# Patient Record
Sex: Male | Born: 1950 | Race: Black or African American | Hispanic: No | Marital: Married | Smoking: Former smoker
Health system: Southern US, Community
[De-identification: ages and names within clinical notes are randomized; demographics above are authoritative.]

## PROBLEM LIST (undated history)

## (undated) DIAGNOSIS — I6529 Occlusion and stenosis of unspecified carotid artery: Secondary | ICD-10-CM

## (undated) DIAGNOSIS — E119 Type 2 diabetes mellitus without complications: Secondary | ICD-10-CM

## (undated) DIAGNOSIS — K5792 Diverticulitis of intestine, part unspecified, without perforation or abscess without bleeding: Secondary | ICD-10-CM

## (undated) DIAGNOSIS — M109 Gout, unspecified: Secondary | ICD-10-CM

## (undated) DIAGNOSIS — I10 Essential (primary) hypertension: Secondary | ICD-10-CM

## (undated) DIAGNOSIS — I350 Nonrheumatic aortic (valve) stenosis: Secondary | ICD-10-CM

## (undated) DIAGNOSIS — R42 Dizziness and giddiness: Secondary | ICD-10-CM

## (undated) DIAGNOSIS — I2510 Atherosclerotic heart disease of native coronary artery without angina pectoris: Secondary | ICD-10-CM

## (undated) DIAGNOSIS — E785 Hyperlipidemia, unspecified: Secondary | ICD-10-CM

## (undated) DIAGNOSIS — I739 Peripheral vascular disease, unspecified: Secondary | ICD-10-CM

## (undated) HISTORY — DX: Occlusion and stenosis of unspecified carotid artery: I65.29

## (undated) HISTORY — DX: Atherosclerotic heart disease of native coronary artery without angina pectoris: I25.10

## (undated) HISTORY — DX: Dizziness and giddiness: R42

## (undated) HISTORY — DX: Essential (primary) hypertension: I10

## (undated) HISTORY — PX: FEMORAL ARTERY STENT: SHX1583

## (undated) HISTORY — DX: Diverticulitis of intestine, part unspecified, without perforation or abscess without bleeding: K57.92

## (undated) HISTORY — DX: Type 2 diabetes mellitus without complications: E11.9

## (undated) HISTORY — DX: Peripheral vascular disease, unspecified: I73.9

## (undated) HISTORY — DX: Gout, unspecified: M10.9

## (undated) HISTORY — DX: Hyperlipidemia, unspecified: E78.5

---

## 1996-02-22 HISTORY — PX: CARDIAC SURGERY: SHX584

## 2010-02-21 HISTORY — PX: BACK SURGERY: SHX140

## 2016-02-18 ENCOUNTER — Other Ambulatory Visit: Payer: Self-pay | Admitting: *Deleted

## 2016-02-18 DIAGNOSIS — I6522 Occlusion and stenosis of left carotid artery: Secondary | ICD-10-CM

## 2016-03-03 ENCOUNTER — Encounter: Payer: Self-pay | Admitting: Vascular Surgery

## 2016-03-07 ENCOUNTER — Ambulatory Visit (HOSPITAL_COMMUNITY)
Admission: RE | Admit: 2016-03-07 | Discharge: 2016-03-07 | Disposition: A | Discharge status: Home / Self Care | Payer: Medicare Other | Source: Ambulatory Visit | Attending: Vascular Surgery | Admitting: Vascular Surgery

## 2016-03-07 DIAGNOSIS — I6522 Occlusion and stenosis of left carotid artery: Secondary | ICD-10-CM | POA: Diagnosis present

## 2016-03-07 LAB — VAS US CAROTID
LCCAPDIAS: 22 cm/s
LCCAPSYS: 127 cm/s
LEFT ECA DIAS: -17 cm/s
LICAPDIAS: -83 cm/s
Left CCA dist dias: 24 cm/s
Left CCA dist sys: 94 cm/s
Left ICA dist dias: -26 cm/s
Left ICA dist sys: -80 cm/s
Left ICA prox sys: -217 cm/s

## 2016-03-08 ENCOUNTER — Encounter: Payer: Self-pay | Admitting: Vascular Surgery

## 2016-03-08 ENCOUNTER — Ambulatory Visit (INDEPENDENT_AMBULATORY_CARE_PROVIDER_SITE_OTHER): Payer: Medicare Other | Admitting: Vascular Surgery

## 2016-03-08 VITALS — BP 127/65 | HR 65 | Temp 98.1°F | Resp 20 | Ht 72.0 in | Wt 196.1 lb

## 2016-03-08 DIAGNOSIS — I6522 Occlusion and stenosis of left carotid artery: Secondary | ICD-10-CM | POA: Diagnosis not present

## 2016-03-08 DIAGNOSIS — R42 Dizziness and giddiness: Secondary | ICD-10-CM

## 2016-03-08 DIAGNOSIS — I6523 Occlusion and stenosis of bilateral carotid arteries: Secondary | ICD-10-CM | POA: Diagnosis not present

## 2016-03-08 NOTE — Progress Notes (Signed)
[facility]  Patient name: Glenn Rollins MRN: [id number] DOB: 1950-09-24 Sex: male  REASON FOR VISIT: Carotid stenosis, referred by Dr. Murlean Caller  HPI: Glenn Rollins is a 66 y.o. male, who presents with complaints of dizziness. This dizziness occurs routinely every day, especially when he gets up too fast or even with reaching down and opening a drawer. He has unfortunately lost consciousness and struck his head in the past. He denies any sudden weakness or numbness of the extremities. He is moving here to [facility] to live with family due to continued dizziness.   During workup for dizziness, left carotid stenosis was discovered. The patient is also complained of an intermittent swishing noise in his left ear.   He is a past medical history of PAD with stents in his bilateral legs. He denies any claudication issues. He has a history of CAD status post CABG 4 in the remote past. He quit smoking at that time. He has hypertension, hyperlipidemia and diabetes are stable. She takes a daily aspirin and statin.  Past Medical History:  Diagnosis Date  . CAD (coronary artery disease)   . Carotid artery occlusion   . Diabetes mellitus without complication (HCC)   . Diverticulitis   . Gout   . Hypertension   . Peripheral vascular disease (HCC)     History reviewed. No pertinent family history.  SOCIAL HISTORY: Social History   Social History  . Marital status: Unknown    Spouse name: N/A  . Number of children: N/A  . Years of education: N/A   Occupational History  . Not on file.   Social History Main Topics  . Smoking status: Former Games developer  . Smokeless tobacco: Never Used  . Alcohol use No  . Drug use: No  . Sexual activity: Not on file   Other Topics Concern  . Not on file   Social History Narrative  . No narrative on file    Not on File  Current Outpatient Prescriptions  Medication Sig Dispense Refill  . amLODipine  (NORVASC) 10 MG tablet Take by mouth.    Marland Kitchen aspirin 81 MG chewable tablet Chew by mouth.    Marland Kitchen atorvastatin (LIPITOR) 80 MG tablet Take by mouth.    . chlorthalidone (HYGROTON) 25 MG tablet Take by mouth.    . clopidogrel (PLAVIX) 75 MG tablet Take by mouth.    . diclofenac sodium (VOLTAREN) 1 % GEL Apply 1 gram to affected area twice daily as needed.    . ezetimibe (ZETIA) 10 MG tablet Take by mouth.    . losartan (COZAAR) 100 MG tablet Take by mouth.    . metFORMIN (GLUCOPHAGE) 500 MG tablet Take by mouth.    . Multiple Vitamin (MULTI VITAMIN DAILY PO) Take by mouth.    . Omega-3 Fatty Acids (FISH OIL) 1000 MG CAPS Take by mouth.     No current facility-administered medications for this visit.     REVIEW OF SYSTEMS:  [X]  denotes positive finding, [ ]  denotes negative finding Cardiac  Comments:  Chest pain or chest pressure:    Shortness of breath upon exertion:    Short of breath when lying flat:    Irregular heart rhythm:        Vascular    Pain in calf, thigh, or hip brought on by ambulation:    Pain in feet at night that wakes you up from your sleep:     Blood clot in your  veins:    Leg swelling:         Pulmonary    Oxygen at home:    Productive cough:     Wheezing:         Neurologic    Sudden weakness in arms or legs:     Sudden numbness in arms or legs:     Sudden onset of difficulty speaking or slurred speech:    Temporary loss of vision in one eye:     Problems with dizziness:  x       Gastrointestinal    Blood in stool:     Vomited blood:         Genitourinary    Burning when urinating:     Blood in urine:        Psychiatric    Major depression:         Hematologic    Bleeding problems:    Problems with blood clotting too easily:        Skin    Rashes or ulcers:        Constitutional    Fever or chills:      PHYSICAL EXAM: Vitals:   03/08/16 1443 03/08/16 1447  BP: 134/70 127/65  Pulse: 65   Resp: 20   Temp: 98.1 F (36.7 C)   TempSrc:  Oral   SpO2: 98%   Weight: 196 lb 1.6 oz (89 kg)   Height: 6' (1.829 m)     GENERAL: The patient is a well-nourished male, in no acute distress. The vital signs are documented above. CARDIAC: There is a regular rate and rhythm. No carotid bruits. VASCULAR: 2+ femoral pulses bilaterally. Faintly palpable right DP pulse. Non palpable left DP pulse.  PULMONARY: There is good air exchange bilaterally without wheezing or rales. ABDOMEN: Soft and non-tender. No pulsatile mass.  MUSCULOSKELETAL: There are no major deformities or cyanosis. NEUROLOGIC: No focal weakness or paresthesias are detected. SKIN: There are no ulcers or rashes noted. PSYCHIATRIC: The patient has a normal affect.  DATA:  Left carotid duplex 03/07/16  L ICA: PSV 217 cm/s EDV 83/cms: suggestive of 60-79% stenosis Left vertebral flow appears absent  Review of carotid duplex from [facility] on 10/02/2015 suggested R ICA stenosis 50-69% and antegrade vertebral flow bilaterally    MEDICAL ISSUES: Asymptomatic left carotid stenosis Dizziness  The patient's velocities on carotid duplex today are likely in the 60-70% stenosis range. He has been asymptomatic without any TIA or stroke symptoms. Discussed that he is below the threshold for surgical intervention and less he becomes symptomatic. He does complain of dizziness on a routine basis. [facility] showed that his vertebral arteries are patent. His dizziness is unlikely due vertebrobasilar insufficiency or carotid stenosis. We will refer him to neurology for evaluation of dizziness. Will follow-up in 1 year with a bilateral carotid duplex. He is on maximal medical management with aspirin and a statin. He is a former smoker.  Peripheral arterial disease  The patient has a prior history of bilateral stenting to his lower extremities. He denies any claudication symptoms at this time. If he does develop any claudication symptoms or nonhealing wounds, we will  obtain further studies at that time. Otherwise,  this will be followed as needed.  Maris BergerKimberly Trinh, PA-C [facility] [place] [phone]   Clinic MD: Ameerah Huffstetler  I have examined the patient, reviewed and agree with above. Very pleasant man moved here from [place] establishing vascular care.  Does have a severe positional dizziness. Workup revealed moderate to severe left carotid stenosis. No focal neurologic deficits. Does have a history of bilateral superficial femoral artery interventions for claudication. Had coronary bypass grafting in [place] age. Will continue follow-up of his carotid stenosis with annual duplex. He will notify should he develop any new symptoms. Will refer to neurology for evaluation of dizziness  Gretta Began, MD 03/08/2016 3:45 PM

## 2016-03-10 NOTE — Addendum Note (Signed)
Addended by: Burton ApleyPETTY, Aniket Paye A on: 03/10/2016 03:34 PM   Modules accepted: Orders

## 2016-04-12 ENCOUNTER — Ambulatory Visit (INDEPENDENT_AMBULATORY_CARE_PROVIDER_SITE_OTHER): Payer: Medicare Other | Admitting: Neurology

## 2016-04-12 ENCOUNTER — Encounter: Payer: Self-pay | Admitting: Neurology

## 2016-04-12 DIAGNOSIS — G6289 Other specified polyneuropathies: Secondary | ICD-10-CM

## 2016-04-12 DIAGNOSIS — M5416 Radiculopathy, lumbar region: Secondary | ICD-10-CM

## 2016-04-12 DIAGNOSIS — G629 Polyneuropathy, unspecified: Secondary | ICD-10-CM | POA: Insufficient documentation

## 2016-04-12 DIAGNOSIS — R42 Dizziness and giddiness: Secondary | ICD-10-CM | POA: Diagnosis not present

## 2016-04-12 NOTE — Progress Notes (Signed)
PATIENT: Glenn Rollins DOB: 24-Jul-1950  Chief Complaint  Patient presents with  . Dizziness    Orthostatic Vitals: Lying: 121/54, 63, Sitting: 131/63, 68, Standing: 130/63, 71.  He has been having positional dizziness over the last six months.  Says his evaluation with cardiology did not determine cause for symptoms.   Marland Kitchen. PCP    Murlean CallerBethany South, MD  . Cardiology    Larina Earthlyodd F Early, MD - referring MD     HISTORICAL  Glenn Rollins is a 66 years old right handed male, seen in refer by vascular surgeon Dr. Kristen Loaderodd F Early for evaluation of dizziness, his primary care physician is Dr. Murlean CallerBethany South. Initial evaluation was on Apr 12 2016.  He has past medical history of hypertension, hyperlipidemia, diabetes since 2016. He had a history of coronary artery disease, CABG surgery in 1998 at age 66, has quit smoking and drinking since then. Peripheral vascular disease, s/p bilateral femoral artery stent in 2000, had right femoral artery stent again in 2017. Prior to the most recent right femoral artery stent in 2017, he presented with low back pain, radiating pain to both hip and leg.  The right emoral artery stent did help his low back pain, bilateral hip pain.   He presented with dizziness since summer of 2017, he complains of transient dizziness with sudden positional change, such as getting up quickly from lying down, sitting down, turing head quickly, he has transient light headedness lasting for few second, he did have one epissoe,when he got up in the middle of night,  going to bathroom, he was so dizzy that he fell, hit the floor with transient loss of consicuousness.  He  Has mild right toes numbness tingling, as if there is socks in between his toes, He used to work at post office, unloading trucks, he has chronic low back pain, some times radiating pain to right leg.  I reviewed ultrasound of carotid artery report from vascular surgeon in January 2018, unable to obtain left  vertebral artery, 60-79% stenosis of left proximal internal carotid artery.  I reviewed MRI lumbar in 2015, L2-3, L3-4, L4-L5, diffuse broad-based disc, with mild facet joint change, there is post operative change right hemi-laminotomy, with superimposed right paracentral disc extrusion, moderate bilateral foraminal narrowing, mild spinal canal stenosis at L4-5, L5-S1.   I reviewed laboratory evaluation in January 2018, A1c 5.9, CMP showed mild elevated glucose otherwise was normal, CBC showed mildly decreased hemoglobin 12.2, normal LDL 95, negative hepatitis C, immunofixation protein electrophoresis,  REVIEW OF SYSTEMS: Full 14 system review of systems performed and notable only for as above  ALLERGIES: Allergies  Allergen Reactions  . Ace Inhibitors   . Prednisone     BP went up    HOME MEDICATIONS: Current Outpatient Prescriptions  Medication Sig Dispense Refill  . amLODipine (NORVASC) 10 MG tablet Take by mouth.    Marland Kitchen. aspirin 81 MG chewable tablet Chew by mouth.    Marland Kitchen. atorvastatin (LIPITOR) 80 MG tablet Take by mouth.    . chlorthalidone (HYGROTON) 25 MG tablet Take by mouth.    . clopidogrel (PLAVIX) 75 MG tablet Take by mouth.    . diclofenac sodium (VOLTAREN) 1 % GEL Apply 1 gram to affected area twice daily as needed.    . ezetimibe (ZETIA) 10 MG tablet Take by mouth.    . losartan (COZAAR) 100 MG tablet Take by mouth.    . metFORMIN (GLUCOPHAGE) 500 MG tablet Take by mouth.    .Marland Kitchen  Multiple Vitamin (MULTI VITAMIN DAILY PO) Take by mouth.    . Omega-3 Fatty Acids (FISH OIL) 1000 MG CAPS Take by mouth.     No current facility-administered medications for this visit.     PAST MEDICAL HISTORY: Past Medical History:  Diagnosis Date  . CAD (coronary artery disease)   . Carotid artery occlusion   . Diabetes mellitus without complication (HCC)   . Diverticulitis   . Dizziness   . Gout   . Hyperlipemia   . Hypertension   . Peripheral vascular disease (HCC)     PAST  SURGICAL HISTORY: Past Surgical History:  Procedure Laterality Date  . BACK SURGERY  2012  . CARDIAC SURGERY  1998   Bypass  . FEMORAL ARTERY STENT      FAMILY HISTORY: No family history on file.  SOCIAL HISTORY:  Social History   Social History  . Marital status: Unknown    Spouse name: N/A  . Number of children: 3  . Years of education: 12th grade   Occupational History  . Retired    Social History Main Topics  . Smoking status: Former Games developer  . Smokeless tobacco: Never Used     Comment: 1998  . Alcohol use No     Comment: 1998  . Drug use: No  . Sexual activity: Not on file   Other Topics Concern  . Not on file   Social History Narrative   Lives at home with daughter.   Right-handed.   1 cup caffeine daily.     PHYSICAL EXAM   Vitals:   04/12/16 1001  BP: (!) 121/54  Pulse: 63  Weight: 193 lb (87.5 kg)  Height: 6' (1.829 m)    Not recorded      Body mass index is 26.18 kg/m. Lying 121/54 63, sitting 131/63 68, standing 130/6371.  PHYSICAL EXAMNIATION:  Gen: NAD, conversant, well nourised, obese, well groomed                     Cardiovascular: Regular rate rhythm, no peripheral edema, warm, nontender. Eyes: Conjunctivae clear without exudates or hemorrhage Neck: Supple, no carotid bruits. Pulmonary: Clear to auscultation bilaterally   NEUROLOGICAL EXAM:  MENTAL STATUS: Speech:    Speech is normal; fluent and spontaneous with normal comprehension.  Cognition:     Orientation to time, place and person     Normal recent and remote memory     Normal Attention span and concentration     Normal Language, naming, repeating,spontaneous speech     Fund of knowledge   CRANIAL NERVES: CN II: Visual fields are full to confrontation. Fundoscopic exam is normal with sharp discs and no vascular changes. Pupils are round equal and briskly reactive to light. CN III, IV, VI: extraocular movement are normal. No ptosis. CN V: Facial sensation is  intact to pinprick in all 3 divisions bilaterally. Corneal responses are intact.  CN VII: Face is symmetric with normal eye closure and smile. CN VIII: Hearing is normal to rubbing fingers CN IX, X: Palate elevates symmetrically. Phonation is normal. CN XI: Head turning and shoulder shrug are intact CN XII: Tongue is midline with normal movements and no atrophy.  MOTOR: There is no pronator drift of out-stretched arms. Muscle bulk and tone are normal. Muscle strength is normal.  REFLEXES: Reflexes are 2+ and symmetric at the biceps, triceps, knees, and ankles. Plantar responses are flexor.  SENSORY: Intact to light touch, pinprick, positional sensation and vibratory sensation  are intact in fingers and toes.  COORDINATION: Rapid alternating movements and fine finger movements are intact. There is no dysmetria on finger-to-nose and heel-knee-shin.    GAIT/STANCE: Posture is normal. Gait is steady with normal steps, base, arm swing, and turning. Heel and toe walking are normal. Tandem gait is normal.  Romberg is absent.   DIAGNOSTIC DATA (LABS, IMAGING, TESTING) - I reviewed patient records, labs, notes, testing and imaging myself where available.   ASSESSMENT AND PLAN  Glenn Rollins is a 66 y.o. male   Hx of DM, now with orthostatic dizziness, possible due to orthostatic blood pressure change  EMG/NCS  Laboratory evaluation for possible etiology Right lumbar radiculopathy  Right low back pain, radiating pain to right leg    Levert Feinstein, M.D. Ph.D.  [facility] [address], [address] [place], [place] [address] Ph: [phone] Fax: [phone]  CC: Larina Earthly, MD, Murlean Caller, MD

## 2016-04-13 LAB — ANA W/REFLEX IF POSITIVE: ANA: NEGATIVE

## 2016-04-13 LAB — CK: Total CK: 158 U/L (ref 24–204)

## 2016-04-13 LAB — VITAMIN B12: VITAMIN B 12: 844 pg/mL (ref 232–1245)

## 2016-04-13 LAB — FOLATE: Folate: >20 ng/mL (ref >3.0)

## 2016-04-13 LAB — TSH: TSH: 2.22 u[IU]/mL (ref 0.450–4.500)

## 2016-04-29 ENCOUNTER — Encounter (INDEPENDENT_AMBULATORY_CARE_PROVIDER_SITE_OTHER): Payer: Self-pay

## 2016-04-29 ENCOUNTER — Ambulatory Visit (INDEPENDENT_AMBULATORY_CARE_PROVIDER_SITE_OTHER): Payer: Medicare Other | Admitting: Neurology

## 2016-04-29 DIAGNOSIS — Z0289 Encounter for other administrative examinations: Secondary | ICD-10-CM

## 2016-04-29 DIAGNOSIS — G6289 Other specified polyneuropathies: Secondary | ICD-10-CM

## 2016-04-29 DIAGNOSIS — M5416 Radiculopathy, lumbar region: Secondary | ICD-10-CM

## 2016-04-29 DIAGNOSIS — R42 Dizziness and giddiness: Secondary | ICD-10-CM

## 2016-04-29 NOTE — Procedures (Signed)
Full Name: Glenn Rollins Gender: Male MRN #: [id number] Date of Birth: 1950/05/07    Visit Date: 04/29/2016 10:47 Age: 66 Years 7 Months Old Examining Physician: Levert Feinstein, MD  Referring Physician: Terrace Arabia, MD History: 66 year old male with history of diabetes, descending with bilateral feet paresthesia, he also had a history of lumbar decompression surgery for right lumbar radiculopathy.  Summary of the test:  Nerve conduction study: Right sural and superficial sensory responses were absent, he had a history of right venous salvation for his CABG surgery in the past. Left sural, and superficial peroneal sensory responses were present, with mildly prolonged peak latency.  Right peroneal motor responses showed mild decreased conduction velocity.  Left peroneal motor responses showed mildly prolonged distal latency, mildly decreased C map amplitude, and with mildly decreased conduction velocity.  Bilateral tibial motor responses showed mildly prolonged distal latency, robust C map response, mild slow conduction velocity.  Bilateral tibial and right peroneal F waves were mildly prolonged, these were likely related to his history of diabetes, and the tall body habitus.  Electromyography: Selected needle examination was performed at bilateral lower extremity muscles and bilateral lumbar sacral paraspinal muscles. There was no significant abnormality.  Conclusion: This is a slight abnormal study. The absent right sural and peroneal sensory responses could due to previous right venous salvation surgery. The prolonged distal latency and F-wave latency mild slow conduction velocity on multiple motor nerves tested could due to cold limb temperature and his tall body habitus.  There is no electrodiagnostic evidence of large fiber peripheral neuropathy or bilateral lumbosacral radiculopathy.    ------------------------------- Levert Feinstein, M.D.  [facility] [address] [place], [place] [address] Tel: [phone] Fax: [phone]        [facility]    Nerve / Sites Rec. Site Peak Lat Ref. Amp.1-2 Ref. Distance    ms ms V V cm  R Sural - Ankle (Calf)     Calf Ankle NR ?4.40 NR ?6.0 14  L Sural - Ankle (Calf)     Calf Ankle 4.53 ?4.40 8.3 ?6.0 14  R Superficial peroneal - Ankle     Lat leg Ankle NR ?4.40 NR ?6.0 14  L Superficial peroneal - Ankle     Lat leg Ankle 5.00 ?4.40 7.0 ?6.0 14     [facility]    Nerve / Sites Muscle Latency Ref. Amplitude Ref. Rel Amp Segments Distance Lat Diff Velocity Ref. Area    ms ms mV mV %  cm ms m/s m/s mVms  R Peroneal - EDB     Ankle EDB 6.1 ?6.5 3.9 ?2.0 100 Ankle - EDB 9    10.6     Fib head EDB 13.1  2.7  69.6 Fib head - Ankle 26 7.0 37 ?44 9.7     Pop fossa EDB 16.2  2.4  87.9 Pop fossa - Fib head 12 3.1 38 ?44 8.8         Pop fossa - Ankle  10.1     L Peroneal - EDB     Ankle EDB 7.1 ?6.5 1.5 ?2.0 100 Ankle - EDB 9    6.1     Fib head EDB 14.9  1.2  83.1 Fib head - Ankle 28 7.8 36 ?44 5.1     Pop fossa EDB 17.3  1.1  87.1 Pop fossa - Fib head 10 2.4 41 ?44 4.8         Pop fossa - Ankle  10.3     R Tibial - AH     Ankle AH 5.8 ?5.8 4.7 ?4.0 100 Ankle - AH 9    10.2     Pop fossa AH 17.9  1.7  36.4 Pop fossa - Ankle 42 12.0 35 ?41 4.8  L Tibial - AH     Ankle AH 6.0 ?5.8 7.1 ?4.0 100 Ankle - AH 9    18.1     Pop fossa AH 17.4  5.1  71.1 Pop fossa - Ankle 42 11.5 37 ?41 16.8     F  Wave    Nerve F Lat Ref.   ms ms  R Peroneal - EDB 60.4 ?56.0  R Tibial - AH 61.7 ?56.0  L Tibial - AH 61.9 ?56.0     EMG full       EMG Summary Table    Spontaneous MUAP Recruitment  Muscle IA Fib PSW Fasc Other Amp Dur. Poly Pattern  R. Tibialis posterior Normal None None None _______ Normal Normal Normal Normal  R. Tibialis anterior Normal None None None _______ Normal Normal Normal Normal  R. Vastus lateralis Normal None None None _______ Normal Normal Normal Normal  L. Tibialis anterior Normal None None None _______  Normal Normal Normal Normal  L. Vastus lateralis Normal None None None _______ Normal Normal Normal Normal  L. Gastrocnemius (Medial head) Normal None None None _______ Normal Normal Normal Normal  L. Lumbar paraspinals (mid) Normal None None None _______ Normal Normal Normal Normal  L. Lumbar paraspinals (low) Normal None None None _______ Normal Normal Normal Normal  R. Lumbar paraspinals (low) Normal None None None _______ Normal Normal Normal Normal  R. Lumbar paraspinals (mid) Normal None None None _______ Normal Normal Normal Normal

## 2016-09-02 ENCOUNTER — Ambulatory Visit: Payer: Medicare Other | Attending: Family Medicine

## 2016-09-02 DIAGNOSIS — R42 Dizziness and giddiness: Secondary | ICD-10-CM

## 2016-09-02 DIAGNOSIS — R2689 Other abnormalities of gait and mobility: Secondary | ICD-10-CM | POA: Diagnosis present

## 2016-09-02 NOTE — Therapy (Signed)
[facility] [address] [place], [place], [address] Phone: [phone]   Fax:  [phone]  Physical Therapy Evaluation  Patient Details  Name: Glenn Rollins MRN: [id number] Date of Birth: [birth year] Referring Provider: Dr. Evlyn KannerSouth  Encounter Date: 09/02/2016      PT End of Session - 09/02/16 1002    Visit Number 1   Number of Visits 5   Date for PT Re-Evaluation 10/02/16   Authorization Type Medicare and BCBS Federal secondary (G-CODE AND PN EVERY 10TH VISIT)   PT Start Time 0848   PT Stop Time 0933   PT Time Calculation (min) 45 min   Equipment Utilized During Treatment Gait belt   Activity Tolerance Patient tolerated treatment well   Behavior During Therapy WFL for tasks assessed/performed      Past Medical History:  Diagnosis Date  . CAD (coronary artery disease)   . Carotid artery occlusion   . Diabetes mellitus without complication (HCC)   . Diverticulitis   . Dizziness   . Gout   . Hyperlipemia   . Hypertension   . Peripheral vascular disease [facility](HCC)     Past Surgical History:  Procedure Laterality Date  . BACK SURGERY  2012  . CARDIAC SURGERY  1998   Bypass  . FEMORAL ARTERY STENT      There were no vitals filed for this visit.       Subjective Assessment - 09/02/16 0856    Subjective Pt reported dizziness began approx. 1 year ago, first episode was at church and he felt like the world was moving. Dizziness described as lightheadedness now vs. world moving, worse when looking quickly, lying back and lifting weights at gym, bending forward. It is also worse when standing and he did pass out once, when getting OOB. Looking down also incr. dizziness. Pt reports dizziness at worst is 5/10 and at best: 0/10 (if pt sits down). Pt passed out once, and fell but no falls other than passing out. Pt denied HA, N/T, and weakness.    Pertinent History CAD, PVD, orthostatic hypotension, peripheral  neuropathy (R toes), DM, Lx radiculopathy, Hx of B femoral stents with revision in 02/2016, gout, Carotid occulsion (pt reported 70%)   Patient Stated Goals Find out why dizziness is happening   Currently in Pain? No/denies            [facility] PT Assessment - 09/02/16 0859      Assessment   Medical Diagnosis Dizziness and giddiness   Referring Provider Dr. Evlyn Kanner[place]   Onset Date/Surgical Date 09/03/15   Hand Dominance Right   Prior Therapy none     Precautions   Precautions Fall     Restrictions   Weight Bearing Restrictions No     Balance Screen   Has the patient fallen in the past 6 months No   Has the patient had a decrease in activity level because of a fear of falling?  Yes   Is the patient reluctant to leave their home because of a fear of falling?  No     Home Environment   Living Environment Private residence   Living Arrangements Children  dtr   Available Help at Discharge Family   Type of Home House   Home Access Level entry   Home Layout Two level   Alternate Level Stairs-Number of Steps 12    Alternate Level Stairs-Rails Right   Home Equipment None     Prior Function   Level  of Independence Independent   Vocation Retired   Personnel officer, Conservation officer, historic buildings, Forensic scientist at [employer], [employer] cars, garden     Cognition   Overall Cognitive Status Within Functional Limits for tasks assessed     Sensation   Light Touch Impaired by gross assessment   Additional Comments Pt reports R toe N/T 2/2 peripheral neuropathy     Ambulation/Gait   Ambulation/Gait Yes   Ambulation/Gait Assistance 5: Supervision   Ambulation/Gait Assistance Details Pt amb. in guarded manner and required rest break upon standing and turning 2/2 lightheadedness.   Ambulation Distance (Feet) 100 Feet   Assistive device None   Gait Pattern Step-through pattern;Within Functional Limits   Ambulation Surface Level;Indoor   Gait velocity 3.63ft/sec. no AD     Functional Gait  Assessment   Gait  assessed  Yes   Gait Level Surface Walks 20 ft in less than 7 sec but greater than 5.5 sec, uses assistive device, slower speed, mild gait deviations, or deviates 6-10 in outside of the 12 in walkway width.  7.8 sec.   Change in Gait Speed Able to change speed, demonstrates mild gait deviations, deviates 6-10 in outside of the 12 in walkway width, or no gait deviations, unable to achieve a major change in velocity, or uses a change in velocity, or uses an assistive device.   Gait with Horizontal Head Turns Performs head turns smoothly with no change in gait. Deviates no more than 6 in outside 12 in walkway width   Gait with Vertical Head Turns Performs task with slight change in gait velocity (eg, minor disruption to smooth gait path), deviates 6 - 10 in outside 12 in walkway width or uses assistive device   Gait and Pivot Turn Pivot turns safely within 3 sec and stops quickly with no loss of balance.   Step Over Obstacle Is able to step over 2 stacked shoe boxes taped together (9 in total height) without changing gait speed. No evidence of imbalance.   Gait with Narrow Base of Support Is able to ambulate for 10 steps heel to toe with no staggering.   Gait with Eyes Closed Walks 20 ft, uses assistive device, slower speed, mild gait deviations, deviates 6-10 in outside 12 in walkway width. Ambulates 20 ft in less than 9 sec but greater than 7 sec.   Ambulating Backwards Walks 20 ft, uses assistive device, slower speed, mild gait deviations, deviates 6-10 in outside 12 in walkway width.   Steps Alternating feet, no rail.   Total Score 25   FGA comment: Indicates pt is a low risk for falls. Pt experienced slight incr. dizziness during activities which require incr. vestibular input.            Vestibular Assessment - 09/02/16 0906      Symptom Behavior   Type of Dizziness Lightheadedness  and world moves   Frequency of Dizziness Daily   Duration of Dizziness <1 minute   Aggravating Factors  Turning head quickly;Turning body quickly;Sit to stand  bending foward, pt also felt dizzy after driving last week   Relieving Factors Rest;Slow movements     Occulomotor Exam   Occulomotor Alignment Normal   Spontaneous Absent   Gaze-induced Absent   Smooth Pursuits Intact   Saccades Intact   Comment Pt denied reproduction of dizziness during exam. B HIT (-)     Vestibulo-Occular Reflex   VOR 1 Head Only (x 1 viewing) WNL   VOR Cancellation Normal     Positional  Testing   Dix-Hallpike Dix-Hallpike Right;Dix-Hallpike Left   Horizontal Canal Testing Horizontal Canal Right;Horizontal Canal Left     Dix-Hallpike Right   Dix-Hallpike Right Duration none   Dix-Hallpike Right Symptoms No nystagmus     Dix-Hallpike Left   Dix-Hallpike Left Duration none   Dix-Hallpike Left Symptoms No nystagmus     Horizontal Canal Right   Horizontal Canal Right Duration none   Horizontal Canal Right Symptoms Normal     Horizontal Canal Left   Horizontal Canal Left Duration none   Horizontal Canal Left Symptoms Normal     Orthostatics   BP supine (x 5 minutes) 138/63  no dizziness in supine   HR supine (x 5 minutes) 60   BP sitting 127/58  no dizziness upon sitting upright   HR sitting 60   BP standing (after 1 minute) 130/56  no dizziness   HR standing (after 1 minute) 66        Objective measurements completed on examination: See above findings.                  PT Education - 09/02/16 1001    Education provided Yes   Education Details PT educated pt on exam findings and PT POC, frequency, and duration. PT educated pt that we will provide pt with vestibular exercises and then refer back to MD.   Person(s) Educated Patient   Methods Explanation   Comprehension Verbalized understanding          PT Short Term Goals - 09/02/16 1013      PT SHORT TERM GOAL #1   Title same as LTGs           PT Long Term Goals - 09/02/16 1013      PT LONG TERM GOAL #1    Title Pt will be IND in HEP to improve balance and safety. TARGET DATE FOR ALL LTGS: 09/30/16   Status New     PT LONG TERM GOAL #2   Title Pt will improve FGA score to 30/30 to reduce falls risk.    Status New     PT LONG TERM GOAL #3   Title Pt will perform head turns and 180 turns with </=2/10 dizziness to improve safety.   Status New                Plan - 09/02/16 1005    Clinical Impression Statement Pt is a pleasant 65y/o male presenting to OPPT neuro for dizziness. Pt's PMH history is signficant for the following: CAD, PVD, orthostatic hypotension, peripheral neuropathy (R toes), DM, Lx radiculopathy, Hx of B femoral stents with revision in 02/2016, gout, Carotid occulsion (pt reported 70%). The following deficits were noted upon exam: gait deviations during dynamic activities, dizziness, and impaired balance. All positional testing negative for sx's and nystagmus. Orthostatic hypotension testing negative but pt did report dizziness upon standing during exam and when standing to amb. from lobby to gym. Pt's FGA score indicates pt is at low risk for falls and gait speed is WNL. However, pt did experience incr. postural sway and dizziness during activities which required incr. vestibular input during FGA, therefore, PT will see pt for several visits to improve vestibular input. Pt would benefit from skilled PT to improve safety during functional mobility.    History and Personal Factors relevant to plan of care: Pt ablity to assist at church is limited 2/2 dizziness.    Clinical Presentation due to: CAD, PVD, orthostatic hypotension, peripheral neuropathy (  R toes), DM, Lx radiculopathy, Hx of B femoral stents with revision in 02/2016, gout, Carotid occulsion (pt reported 70%)   Rehab Potential Good   Clinical Impairments Affecting Rehab Potential see above   PT Frequency 1x / week   PT Duration 4 weeks   PT Treatment/Interventions Canalith Repostioning;Neuromuscular  re-education;Balance training;Therapeutic exercise;Therapeutic activities;Functional mobility training;Stair training;Gait training;DME Instruction;Patient/family education;Vestibular   PT Next Visit Plan Provide pt with vestibular HEP and potentially d/c after 1-2 visits   Consulted and Agree with Plan of Care Patient      Patient will benefit from skilled therapeutic intervention in order to improve the following deficits and impairments:  Abnormal gait, Dizziness, Decreased balance  Visit Diagnosis: Dizziness and giddiness - Plan: PT plan of care cert/re-cert  Other abnormalities of gait and mobility - Plan: PT plan of care cert/re-cert      G-Codes - 09/09/2016 1015    Functional Assessment Tool Used (Outpatient Only) FGA: 25/30   Functional Limitation Mobility: Walking and moving around   Mobility: Walking and Moving Around Current Status (O9629) At least 1 percent but less than 20 percent impaired, limited or restricted   Mobility: Walking and Moving Around Goal Status (B2841) 0 percent impaired, limited or restricted       Problem List Patient Active Problem List   Diagnosis Date Noted  . Lumbar radiculopathy, right 04/12/2016  . Peripheral neuropathy 04/12/2016  . Orthostatic dizziness 04/12/2016    Maxcine Strong L 2016/09/09, 10:16 AM  [facility] [address] [place], [place], [address] Phone: [phone]   Fax:  [phone]  Name: Glenn Rollins MRN: [id number] Date of Birth: March 25, 1950  Zerita Boers, PT,DPT 2016/09/09 10:17 AM Phone: [phone] Fax: [phone]

## 2016-09-06 ENCOUNTER — Ambulatory Visit: Payer: Medicare Other

## 2016-09-06 DIAGNOSIS — R42 Dizziness and giddiness: Secondary | ICD-10-CM

## 2016-09-06 DIAGNOSIS — R2689 Other abnormalities of gait and mobility: Secondary | ICD-10-CM

## 2016-09-06 NOTE — Patient Instructions (Addendum)
Turning    Tilt head down 15-30, lead with head and eyes and slowly make quarter turn to left with eyes open. Hold position until symptoms subside. Repeat __5__ times per session. Do __1__ sessions per day.  Copyright  VHI. All rights reserved.   Perform in corner with chair in front of you for safety OR at kitchen sink with chair behind you:  Feet Together (Compliant Surface) Head Motion - Eyes Closed    Stand on compliant surface: ___pillows/cushion_____ with feet together. Close eyes and move head slowly, up and down 5 times and side to side 5 times. Perform up and down head motion in limited range, as we did during PT session. Repeat _3___ times per session. Do __1__ sessions per day.  Copyright  VHI. All rights reserved.  Up / Down Head Motion    Perform near counter or in hallway to hold on as needed. Walking on solid surface, move head and eyes toward ceiling for __2__ steps.  Then, move head and eyes toward floor for ___2_ steps. Repeat __4__ times per session. Do __1__ sessions per day.   Copyright  VHI. All rights reserved.  Side to Side Head Motion    Perform at counter or in hallway to hold onto for safety. Walking on solid surface, turn head and eyes to left for ____ steps. Then, turn head and eyes to opposite side for __2__ steps. Repeat sequence __3__ times per session. Do __1__ sessions per day.  Copyright  VHI. All rights reserved.

## 2016-09-06 NOTE — Therapy (Signed)
[facility] [address] [place], [place], [address] Phone: [phone]   Fax:  [phone]  Physical Therapy Treatment  Patient Details  Name: Glenn Rollins MRN: [id number] Date of Birth: 1950/06/03 Referring Provider: Dr. Forde Dandy  Encounter Date: 09/06/2016      PT End of Session - 09/06/16 1045    Visit Number 2   Number of Visits 5   Date for PT Re-Evaluation 10/02/16   Authorization Type Medicare and [place] secondary (G-CODE AND PN EVERY 10TH VISIT)   PT Start Time 1016   PT Stop Time 1042   PT Time Calculation (min) 26 min   Equipment Utilized During Treatment --  S prn   Activity Tolerance Patient tolerated treatment well   Behavior During Therapy WFL for tasks assessed/performed      Past Medical History:  Diagnosis Date  . CAD (coronary artery disease)   . Carotid artery occlusion   . Diabetes mellitus without complication ([place])   . Diverticulitis   . Dizziness   . Gout   . Hyperlipemia   . Hypertension   . Peripheral vascular disease [facility])     Past Surgical History:  Procedure Laterality Date  . BACK SURGERY  2012  . CARDIAC SURGERY  1998   Bypass  . FEMORAL ARTERY STENT      There were no vitals filed for this visit.      Subjective Assessment - 09/06/16 1019    Subjective Pt denied changes or falls since last visit. Pt went to the gym yesterday and reported he continues to have dizziness upon standing after seated exercises. Pt reported blurred vision after performing biceps machine.    Pertinent History CAD, PVD, orthostatic hypotension, peripheral neuropathy (R toes), DM, Lx radiculopathy, Hx of B femoral stents with revision in 02/2016, gout, Carotid occulsion (pt reported 70%)   Patient Stated Goals Find out why dizziness is happening   Currently in Pain? No/denies                              Balance Exercises - 09/06/16 1049      Balance  Exercises: Standing   Gait with Head Turns Forward;Other reps (comment)  8 reps, vertical/horizontal head turns in // bars   Turning Both;5 reps  half turns with spotting target           PT Education - 09/06/16 1044    Education provided Yes   Education Details PT provided habituation and vestibular HEP, and that PT feels pt's s/s need to be managed by MD as s/s are consistent with vascular issues. PT will hold pt, as pt has appt with MD tomorrow (09/07/16) and then likely d/c.   Person(s) Educated Patient   Methods Explanation;Demonstration;Verbal cues;Handout   Comprehension Returned demonstration;Verbalized understanding          PT Short Term Goals - 09/02/16 1013      PT SHORT TERM GOAL #1   Title same as LTGs           PT Long Term Goals - 09/06/16 1047      PT LONG TERM GOAL #1   Title Pt will be IND in HEP to improve balance and safety. TARGET DATE FOR ALL LTGS: 09/30/16   Status New     PT LONG TERM GOAL #2   Title Pt will improve FGA score to 30/30 to reduce falls risk.  Status New     PT LONG TERM GOAL #3   Title Pt will perform head turns and 180 turns with </=2/10 dizziness to improve safety.   Status Achieved               Plan - 09/06/16 1046    Clinical Impression Statement PT unable to reproduce s/s today during session. LTG 2 met. However, PT provided pt with habituation and vestibular HEP to ensure vestibular input remains WNL. Pt reported dizziness during gym activities which required incr. cervical ext. Pt's s/s are consistent with vascular issues and PT encouraged pt to f/u with MD, as this is outside PT scope of practice. PT will hold pt until he sees MD tomorrow and then likely d/c pt.    Rehab Potential Good   Clinical Impairments Affecting Rehab Potential see above   PT Frequency 1x / week   PT Duration 4 weeks   PT Treatment/Interventions Canalith Repostioning;Neuromuscular re-education;Balance training;Therapeutic  exercise;Therapeutic activities;Functional mobility training;Stair training;Gait training;DME Instruction;Patient/family education;Vestibular   PT Next Visit Plan d/c   Consulted and Agree with Plan of Care Patient      Patient will benefit from skilled therapeutic intervention in order to improve the following deficits and impairments:  Abnormal gait, Dizziness, Decreased balance  Visit Diagnosis: Dizziness and giddiness  Other abnormalities of gait and mobility     Problem List Patient Active Problem List   Diagnosis Date Noted  . Lumbar radiculopathy, right 04/12/2016  . Peripheral neuropathy 04/12/2016  . Orthostatic dizziness 04/12/2016    Iman Reinertsen L 09/06/2016, 10:49 AM  [facility] [address] [place], [place], [address] Phone: [phone]   Fax:  [phone]  Name: Glenn Rollins MRN: [id number] Date of Birth: [birth year]  Geoffry Paradise, PT,DPT 09/06/16 10:50 AM Phone: [phone] Fax: [phone]

## 2016-09-13 ENCOUNTER — Other Ambulatory Visit: Payer: Self-pay | Admitting: *Deleted

## 2016-09-13 DIAGNOSIS — I6523 Occlusion and stenosis of bilateral carotid arteries: Secondary | ICD-10-CM

## 2016-09-27 NOTE — Therapy (Signed)
[facility] [address] [place], [place], [address] Phone: [phone]   Fax:  [phone]  Patient Details  Name: Glenn Rollins MRN: [id number] Date of Birth: 10-24-1950 Referring Provider:  No ref. provider found  Encounter Date: 10-27-16  PHYSICAL THERAPY DISCHARGE SUMMARY  Visits from Start of Care: 2  Current functional level related to goals / functional outcomes:     PT Long Term Goals - 09/06/16 1047      PT LONG TERM GOAL #1   Title Pt will be IND in HEP to improve balance and safety. TARGET DATE FOR ALL LTGS: 09/30/16   Status New     PT LONG TERM GOAL #2   Title Pt will improve FGA score to 30/30 to reduce falls risk.    Status New     PT LONG TERM GOAL #3   Title Pt will perform head turns and 180 turns with </=2/10 dizziness to improve safety.   Status Achieved        Remaining deficits: Unknown, as pt cancelled remaining appt's per MD.    Education / Equipment: PT POC, duration, exam findings, and frequency.  Plan: Patient agrees to discharge.  Patient goals were not met. Patient is being discharged due to the physician's request.  ?????           G-Codes - Oct 27, 2016 1355    Functional Assessment Tool Used (Outpatient Only) FGA: 25/30 (same as baseline as pt did not return)   Functional Limitation Mobility: Walking and moving around   Mobility: Walking and Moving Around Current Status (H6808) At least 1 percent but less than 20 percent impaired, limited or restricted   Mobility: Walking and Moving Around Goal Status [phone]) 0 percent impaired, limited or restricted   Mobility: Walking and Moving Around Discharge Status [phone]) At least 1 percent but less than 20 percent impaired, limited or restricted      Netasha Wehrli L 10/27/2016, 1:56 PM  [facility] [address] [place], [place], [address] Phone: [phone]    Fax:  [phone]   Geoffry Paradise, PT,DPT Oct 27, 2016 1:57 PM Phone: [phone] Fax: [phone]

## 2016-10-03 ENCOUNTER — Encounter: Payer: Self-pay | Admitting: Vascular Surgery

## 2016-10-11 ENCOUNTER — Encounter: Payer: Self-pay | Admitting: Vascular Surgery

## 2016-10-11 ENCOUNTER — Ambulatory Visit (INDEPENDENT_AMBULATORY_CARE_PROVIDER_SITE_OTHER): Payer: Medicare Other | Admitting: Vascular Surgery

## 2016-10-11 VITALS — BP 144/67 | HR 54 | Temp 98.4°F | Ht 72.0 in | Wt 193.0 lb

## 2016-10-11 DIAGNOSIS — I6522 Occlusion and stenosis of left carotid artery: Secondary | ICD-10-CM

## 2016-10-11 DIAGNOSIS — I6523 Occlusion and stenosis of bilateral carotid arteries: Secondary | ICD-10-CM

## 2016-10-11 DIAGNOSIS — R42 Dizziness and giddiness: Secondary | ICD-10-CM | POA: Diagnosis not present

## 2016-10-11 NOTE — Progress Notes (Signed)
[facility]  Patient name: Glenn Rollins MRN: [id number] DOB: [birth year] Sex: male  REASON FOR VISIT: Continued evaluation of dizziness  HPI: Glenn Rollins is a 66 y.o. male here today for continued evaluation of dizziness. I'd seen him in January 2018. At that time he was found 60-79% left internal carotid artery stenosis by duplex. This was for evaluation of dizziness. His outlying study showed antegrade flow in the vertebral arteries bilaterally although ours did not have any visualization of the left vertebral artery. He has undergone an extensive workup wrist dizziness to include neurologist with MRI of his brain and EEG. Also has undergone evaluation with ear nose and throat and has had positional physical therapy treatment which is still not improved his dizziness. This is quite limiting to him. He is otherwise very active person.  Past Medical History:  Diagnosis Date  . CAD (coronary artery disease)   . Carotid artery occlusion   . Diabetes mellitus without complication (HCC)   . Diverticulitis   . Dizziness   . Gout   . Hyperlipemia   . Hypertension   . Peripheral vascular disease (HCC)     No family history on file.  SOCIAL HISTORY: Social History  Substance Use Topics  . Smoking status: Former Games developer  . Smokeless tobacco: Never Used     Comment: 1998  . Alcohol use No     Comment: 1998    Allergies  Allergen Reactions  . Ace Inhibitors   . Prednisone     BP went up    Current Outpatient Prescriptions  Medication Sig Dispense Refill  . amLODipine (NORVASC) 10 MG tablet Take by mouth.    Marland Kitchen aspirin 81 MG chewable tablet Chew by mouth.    Marland Kitchen atorvastatin (LIPITOR) 80 MG tablet Take by mouth.    . chlorthalidone (HYGROTON) 25 MG tablet Take by mouth.    . clopidogrel (PLAVIX) 75 MG tablet Take by mouth.    . diclofenac sodium (VOLTAREN) 1 % GEL Apply 1 gram to affected area twice  daily as needed.    . ezetimibe (ZETIA) 10 MG tablet Take by mouth.    . losartan (COZAAR) 100 MG tablet Take by mouth.    . metFORMIN (GLUCOPHAGE) 500 MG tablet Take by mouth.    . Multiple Vitamin (MULTI VITAMIN DAILY PO) Take by mouth.    . Omega-3 Fatty Acids (FISH OIL) 1000 MG CAPS Take by mouth.     No current facility-administered medications for this visit.     REVIEW OF SYSTEMS:  [X]  denotes positive finding, [ ]  denotes negative finding Cardiac  Comments:  Chest pain or chest pressure:    Shortness of breath upon exertion:    Short of breath when lying flat:    Irregular heart rhythm:        Vascular    Pain in calf, thigh, or hip brought on by ambulation:    Pain in feet at night that wakes you up from your sleep:     Blood clot in your veins:    Leg swelling:           PHYSICAL EXAM: Vitals:   10/11/16 0958  BP: (!) 144/67  Pulse: (!) 54  Temp: 98.4 F (36.9 C)  TempSrc: Axillary  Weight: 193 lb (87.5 kg)  Height: 6' (1.829 m)    GENERAL: The patient is a well-nourished male, in no acute distress. The vital signs are documented above. CARDIOVASCULAR:  Carotid arteries without bruits bilaterally. 2+ radial pulses bilaterally PULMONARY: There is good air exchange  MUSCULOSKELETAL: There are no major deformities or cyanosis. NEUROLOGIC: No focal weakness or paresthesias are detected. SKIN: There are no ulcers or rashes noted. PSYCHIATRIC: The patient has a normal affect.  DATA:  Reviewed his MRI and EEG and also evaluation from neurology.  MEDICAL ISSUES: Continued dizziness with unclear etiology. I did explain that the vertebrobasilar insufficiency as a potential cause for this. I have recommended CT angiogram for further evaluation. We'll make further recommendations pending this. Assuming this is not show any progression of his carotid stenosis and if he has no evidence of vertebrobasilar insufficiency, would continue his follow-up with carotid duplex in  one year.    Larina Earthly, MD FACS [facility] Tel [phone] Pager [phone]

## 2016-10-12 NOTE — Addendum Note (Signed)
Addended by: Burton Apley A on: 10/12/2016 04:44 PM   Modules accepted: Orders

## 2016-10-13 ENCOUNTER — Telehealth: Payer: Self-pay | Admitting: Vascular Surgery

## 2016-10-13 NOTE — Telephone Encounter (Signed)
Per instructions from Dr.Early on 10/11/16 I scheduled this patient for a CTA neck on 10/17/16 at 8:30am at the [facility]. The patient knows to arrive at 8am and no solid foods 4 hours prior but liquids and medications are okay. awt

## 2016-10-13 NOTE — Telephone Encounter (Signed)
Per Dr.Early's instructions on 10/11/16 I scheduled an appointment for this patient to have a CTA neck on 10/17/16 at 8:30am at the [address] location of [facility]. The patient knows to arrive at 8:10am and no solid foods 4 hours prior but liquids and medications are fine.  I also put a sticky note on Dr.Early's desk so he can call the patient with the results. awt

## 2016-10-17 ENCOUNTER — Ambulatory Visit
Admission: RE | Admit: 2016-10-17 | Discharge: 2016-10-17 | Disposition: A | Discharge status: Home / Self Care | Payer: Medicare Other | Source: Ambulatory Visit | Attending: Vascular Surgery | Admitting: Vascular Surgery

## 2016-10-17 DIAGNOSIS — R42 Dizziness and giddiness: Secondary | ICD-10-CM

## 2016-10-17 DIAGNOSIS — I6523 Occlusion and stenosis of bilateral carotid arteries: Secondary | ICD-10-CM

## 2016-10-17 MED ORDER — IOPAMIDOL (ISOVUE-370) INJECTION 76%
75.0000 mL | Freq: Once | INTRAVENOUS | Status: AC | PRN
  Administered 2016-10-17: 75 mL via INTRAVENOUS

## 2016-11-01 ENCOUNTER — Other Ambulatory Visit: Payer: Self-pay | Admitting: *Deleted

## 2016-11-01 ENCOUNTER — Encounter: Payer: Self-pay | Admitting: Vascular Surgery

## 2016-11-01 NOTE — Telephone Encounter (Signed)
Spoke with the patient by telephone regarding his outpatient CT angiogram on 10/17/2016. I also reviewed the CT with Dr.Deveshwar with neuro interventional radiology. 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 Poor opacification of the right vertebral artery due to artifact from his right sided IV contrast injection.  Have recommended formal cerebral arteriogram for further definition of his posterior circulation and potential treatment options. The patient understands and wishes to proceed. We will schedule this at his convenience

## 2016-11-01 NOTE — Progress Notes (Signed)
I will contact Dr. Fatima Sangereveshwar's IR office and schedule him to arch and cerebral angiograms for this patient. Dr. Arbie CookeyEarly has discussed this with Dr. Corliss Skainseveshwar and patient will not need a pre-angio appt with IR; just angiograms with Dr. Corliss Skainseveshwar only.

## 2016-11-03 ENCOUNTER — Other Ambulatory Visit (HOSPITAL_COMMUNITY): Payer: Self-pay | Admitting: Interventional Radiology

## 2016-11-03 DIAGNOSIS — I771 Stricture of artery: Secondary | ICD-10-CM

## 2016-11-04 ENCOUNTER — Telehealth (HOSPITAL_COMMUNITY): Payer: Self-pay

## 2016-11-04 NOTE — Telephone Encounter (Signed)
Called to schedule angiogram, left message for pt to return call. AW 

## 2016-11-10 ENCOUNTER — Other Ambulatory Visit: Payer: Self-pay | Admitting: Student

## 2016-11-14 ENCOUNTER — Other Ambulatory Visit: Payer: Self-pay | Admitting: Student

## 2016-11-15 ENCOUNTER — Encounter (HOSPITAL_COMMUNITY): Payer: Self-pay

## 2016-11-15 ENCOUNTER — Ambulatory Visit (HOSPITAL_COMMUNITY)
Admission: RE | Admit: 2016-11-15 | Discharge: 2016-11-15 | Disposition: A | Discharge status: Home / Self Care | Payer: Medicare Other | Source: Ambulatory Visit | Attending: Interventional Radiology | Admitting: Interventional Radiology

## 2016-11-15 ENCOUNTER — Other Ambulatory Visit (HOSPITAL_COMMUNITY): Payer: Self-pay | Admitting: Interventional Radiology

## 2016-11-15 DIAGNOSIS — I10 Essential (primary) hypertension: Secondary | ICD-10-CM | POA: Diagnosis not present

## 2016-11-15 DIAGNOSIS — I700 Atherosclerosis of aorta: Secondary | ICD-10-CM | POA: Diagnosis not present

## 2016-11-15 DIAGNOSIS — Z87891 Personal history of nicotine dependence: Secondary | ICD-10-CM | POA: Insufficient documentation

## 2016-11-15 DIAGNOSIS — E785 Hyperlipidemia, unspecified: Secondary | ICD-10-CM | POA: Diagnosis not present

## 2016-11-15 DIAGNOSIS — Z7984 Long term (current) use of oral hypoglycemic drugs: Secondary | ICD-10-CM | POA: Insufficient documentation

## 2016-11-15 DIAGNOSIS — Z7902 Long term (current) use of antithrombotics/antiplatelets: Secondary | ICD-10-CM | POA: Insufficient documentation

## 2016-11-15 DIAGNOSIS — Z7982 Long term (current) use of aspirin: Secondary | ICD-10-CM | POA: Insufficient documentation

## 2016-11-15 DIAGNOSIS — M109 Gout, unspecified: Secondary | ICD-10-CM | POA: Insufficient documentation

## 2016-11-15 DIAGNOSIS — E1151 Type 2 diabetes mellitus with diabetic peripheral angiopathy without gangrene: Secondary | ICD-10-CM | POA: Insufficient documentation

## 2016-11-15 DIAGNOSIS — G450 Vertebro-basilar artery syndrome: Secondary | ICD-10-CM | POA: Diagnosis present

## 2016-11-15 DIAGNOSIS — I771 Stricture of artery: Secondary | ICD-10-CM

## 2016-11-15 DIAGNOSIS — I2510 Atherosclerotic heart disease of native coronary artery without angina pectoris: Secondary | ICD-10-CM | POA: Insufficient documentation

## 2016-11-15 HISTORY — PX: IR ANGIO VERTEBRAL SEL SUBCLAVIAN INNOMINATE BILAT MOD SED: IMG5366

## 2016-11-15 HISTORY — PX: IR ANGIO INTRA EXTRACRAN SEL COM CAROTID INNOMINATE BILAT MOD SED: IMG5360

## 2016-11-15 LAB — CBC
HEMATOCRIT: 31.1 % — AB (ref 39.0–52.0)
HEMOGLOBIN: 10.8 g/dL — AB (ref 13.0–17.0)
MCH: 33.1 pg (ref 26.0–34.0)
MCHC: 34.7 g/dL (ref 30.0–36.0)
MCV: 95.4 fL (ref 78.0–100.0)
Platelets: 219 10*3/uL (ref 150–400)
RBC: 3.26 MIL/uL — AB (ref 4.22–5.81)
RDW: 12 % (ref 11.5–15.5)
WBC: 4.9 10*3/uL (ref 4.0–10.5)

## 2016-11-15 LAB — BASIC METABOLIC PANEL
ANION GAP: 11 (ref 5–15)
BUN: 18 mg/dL (ref 6–20)
CALCIUM: 9.5 mg/dL (ref 8.9–10.3)
CO2: 25 mmol/L (ref 22–32)
Chloride: 101 mmol/L (ref 101–111)
Creatinine, Ser: 0.92 mg/dL (ref 0.61–1.24)
Glucose, Bld: 96 mg/dL (ref 65–99)
POTASSIUM: 3.8 mmol/L (ref 3.5–5.1)
Sodium: 137 mmol/L (ref 135–145)

## 2016-11-15 LAB — PROTIME-INR
INR: 0.97
PROTHROMBIN TIME: 12.8 s (ref 11.4–15.2)

## 2016-11-15 LAB — APTT: APTT: 37 s — AB (ref 24–36)

## 2016-11-15 MED ORDER — MIDAZOLAM HCL 2 MG/2ML IJ SOLN
INTRAMUSCULAR | Status: AC | PRN
  Administered 2016-11-15: 1 mg via INTRAVENOUS

## 2016-11-15 MED ORDER — HEPARIN SODIUM (PORCINE) 1000 UNIT/ML IJ SOLN
INTRAMUSCULAR | Status: AC
  Filled 2016-11-15: qty 2

## 2016-11-15 MED ORDER — ASPIRIN 81 MG PO CHEW
CHEWABLE_TABLET | ORAL | Status: AC
  Filled 2016-11-15: qty 1

## 2016-11-15 MED ORDER — SODIUM CHLORIDE 0.9 % IV SOLN
INTRAVENOUS | Status: AC | PRN
  Administered 2016-11-15: 75 mL/h via INTRAVENOUS

## 2016-11-15 MED ORDER — SODIUM CHLORIDE 0.9 % IV SOLN: INTRAVENOUS | Status: AC

## 2016-11-15 MED ORDER — FENTANYL CITRATE (PF) 100 MCG/2ML IJ SOLN
INTRAMUSCULAR | Status: AC
  Filled 2016-11-15: qty 2

## 2016-11-15 MED ORDER — MIDAZOLAM HCL 2 MG/2ML IJ SOLN
INTRAMUSCULAR | Status: AC
  Filled 2016-11-15: qty 2

## 2016-11-15 MED ORDER — IOPAMIDOL (ISOVUE-300) INJECTION 61%
INTRAVENOUS | Status: AC
  Filled 2016-11-15: qty 50

## 2016-11-15 MED ORDER — FENTANYL CITRATE (PF) 100 MCG/2ML IJ SOLN
INTRAMUSCULAR | Status: AC | PRN
  Administered 2016-11-15: 25 ug via INTRAVENOUS

## 2016-11-15 MED ORDER — IOPAMIDOL (ISOVUE-300) INJECTION 61%
INTRAVENOUS | Status: AC
  Administered 2016-11-15: 75 mL
  Filled 2016-11-15: qty 150

## 2016-11-15 MED ORDER — SODIUM CHLORIDE 0.9 % IV SOLN: INTRAVENOUS | Status: DC

## 2016-11-15 MED ORDER — LIDOCAINE HCL 1 % IJ SOLN
INTRAMUSCULAR | Status: AC | PRN
  Administered 2016-11-15: 10 mL

## 2016-11-15 MED ORDER — HEPARIN SODIUM (PORCINE) 1000 UNIT/ML IJ SOLN
INTRAMUSCULAR | Status: AC | PRN
  Administered 2016-11-15: 1000 [IU] via INTRAVENOUS

## 2016-11-15 MED ORDER — ASPIRIN 81 MG PO CHEW
CHEWABLE_TABLET | ORAL | Status: AC | PRN
  Administered 2016-11-15: 81 mg via ORAL

## 2016-11-15 MED ORDER — LIDOCAINE HCL (PF) 1 % IJ SOLN
INTRAMUSCULAR | Status: AC
  Filled 2016-11-15: qty 20

## 2016-11-15 NOTE — Procedures (Signed)
S/P bilateral CCA anfd bilateral subclavian arteriograms. RT CFA approach. Findings. 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 Occluded RT VBJ distal to  RT PICA. 2.Occluded Lt VA prox with partial reconstitution from ascending cervical branch and  post branch of the ascending pharyngeal  branch of LT ECA.Marland Kitchen 3.Approx 60% stenosis of LT ICA prox associated with 2 ulcerations.Marland Kitchen 4.Patent RT PCOM

## 2016-11-15 NOTE — Discharge Instructions (Addendum)
Outpatient Metformin Instructions (Glucophage, Glucovance, Fortamet, Riomet, Metaglip, Glumetza, Actoplus met  Avandamet, Janumet)   Patient: Glenn Rollins                                                11/15/2016:    Radiology Exam:     As part of your exam today in the Radiology Department, you were given a radiographic contrast material or x-ray dye.  Because you have had this contrast material and you are taking a Metformin drug (Glucophage, Glucovance, Avandamet, Fortamet, Riomet, Metaglip, Glumetza, Actoplus met, Actoplus Met XR, Prandimet or Janumet), please observe the following instructions:   DO NOT  Take this medication for 48 hours after your exam.  Because you have normal renal function and have no comorbidities, you may restart your medication in 48 hours with no need for a renal function test or consultation with your physician.  You have normal renal function but have some comorbidities.  Comorbidities include liver disease, alcohol overuse, heart failure, myocardial or muscular ischemia, sepsis, or other severe infection.  Therefore you should consult your physician before restarting your medication.  You have impaired renal function.  You should consult your physician before restarting your medication and you are advised to get a renal function test before restarting your medication.  Please discuss this with your physician.   Call your doctor before you start taking this medication again.  Your doctor may want to check your kidney function before you start taking this medication again.  I understand these instructions and have had an opportunity to discuss them with Radiology Department personnel.      Cerebral Angiogram, Care After Refer to this sheet in the next few weeks. These instructions provide you with information on caring for yourself after your procedure. Your health care provider may also give you more specific instructions. Your treatment  has been planned according to current medical practices, but problems sometimes occur. Call your health care provider if you have any problems or questions after your procedure. What can I expect after the procedure? After your procedure, it is typical to have the following:  Bruising at the catheter insertion site that usually fades within 1-2 weeks.  Blood collecting in the tissue (hematoma) that may be painful to the touch. It should usually decrease in size and tenderness within 1-2 weeks.  A mild headache.  Follow these instructions at home:  Take medicines only as directed by your health care provider.  You may shower 24-48 hours after the procedure or as directed by your health care provider. Remove the bandage (dressing) and gently wash the site with plain soap and water. Pat the area dry with a clean towel. Do not rub the site, because this may cause bleeding.  Do not take baths, swim, or use a hot tub until your health care provider approves.  Check your insertion site every day for redness, swelling, or drainage.  Do not apply powder or lotion to the site.  Do not lift over 10 lb (4.5 kg) for 5 days after your procedure or as directed by your health care provider.  Ask your health care provider when it is okay to: ? Return to work or school. ? Resume usual physical activities or sports. ? Resume sexual activity.  Do not drive home if you are discharged the same  day as the procedure. Have someone else drive you.  You may drive 24 hours after the procedure unless otherwise instructed by your health care provider.  Do not operate machinery or power tools for 24 hours after the procedure or as directed by your health care provider.  If your procedure was done as an outpatient procedure, which means that you went home the same day as your procedure, a responsible adult should be with you for the first 24 hours after you arrive home.  Keep all follow-up visits as directed by  your health care provider. This is important. Contact a health care provider if:  You have a fever.  You have chills.  You have increased bleeding from the catheter insertion site. Hold pressure on the site. Get help right away if:  You have vision changes or loss of vision.  You have numbness or weakness on one side of your body.  You have difficulty talking, or you have slurred speech or cannot speak (aphasia).  You feel confused or have difficulty remembering.  You have unusual pain at the catheter insertion site.  You have redness, warmth, or swelling at the catheter insertion site.  You have drainage (other than a small amount of blood on the dressing) from the catheter insertion site.  The catheter insertion site is bleeding, and the bleeding does not stop after 30 minutes of holding steady pressure on the site. These symptoms may represent a serious problem that is an emergency. Do not wait to see if the symptoms will go away. Get medical help right away. Call your local emergency services (911 in [place] Do not drive yourself to the hospital. This information is not intended to replace advice given to you by your health care provider. Make sure you discuss any questions you have with your health care provider. Document Released: 06/24/2013 Document Revised: 07/16/2015 Document Reviewed: 02/20/2013 Elsevier Interactive Patient Education  2017 [place]. Moderate Conscious Sedation, Adult, Care After These instructions provide you with information about caring for yourself after your procedure. Your health care provider may also give you more specific instructions. Your treatment has been planned according to current medical practices, but problems sometimes occur. Call your health care provider if you have any problems or questions after your procedure. What can I expect after the procedure? After your procedure, it is common:  To feel sleepy for several hours.  To feel  clumsy and have poor balance for several hours.  To have poor judgment for several hours.  To vomit if you eat too soon.  Follow these instructions at home: For at least 24 hours after the procedure:   Do not: ? Participate in activities where you could fall or become injured. ? Drive. ? Use heavy machinery. ? Drink alcohol. ? Take sleeping pills or medicines that cause drowsiness. ? Make important decisions or sign legal documents. ? Take care of children on your own.  Rest. Eating and drinking  Follow the diet recommended by your health care provider.  If you vomit: ? Drink water, juice, or soup when you can drink without vomiting. ? Make sure you have little or no nausea before eating solid foods. General instructions  Have a responsible adult stay with you until you are awake and alert.  Take over-the-counter and prescription medicines only as told by your health care provider.  If you smoke, do not smoke without supervision.  Keep all follow-up visits as told by your health care provider. This is important.  Contact a health care provider if:  You keep feeling nauseous or you keep vomiting.  You feel light-headed.  You develop a rash.  You have a fever. Get help right away if:  You have trouble breathing. This information is not intended to replace advice given to you by your health care provider. Make sure you discuss any questions you have with your health care provider. Document Released: 11/28/2012 Document Revised: 07/13/2015 Document Reviewed: 05/30/2015 Elsevier Interactive Patient Education  [employer].

## 2016-11-15 NOTE — Sedation Documentation (Signed)
ETC02 removed per Dr. Deveshwar  

## 2016-11-15 NOTE — H&P (Signed)
Chief Complaint: Patient was seen in consultation today for cerebral arteriogram at the request of Dr T Early  Referring Physician(s): Dr Brantley Fling  Supervising Physician: Julieanne Cotton  Patient Status: [facility] - Out-pt  History of Present Illness: Glenn Rollins is a 66 y.o. male   Dizziness for over 1 yr Noted especially if stands too quickly or bends down and comes up Has seen Vascular surgery Dr Karie Schwalbe Early Dopplers show left internal carotid artery stenosis CTA 8/27: IMPRESSION: 1. Age advanced atherosclerosis including Aortic Atherosclerosis (ICD10-I70.0). 2. 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 3. Poor opacification of the right vertebral artery with V3 segment occlusion. Right V1 segment obscured by streak artifact. 4. 75% proximal left ICA stenosis. 5. No significant stenosis of the right ICA or of either common carotid artery.  Scheduled now for cerebral arteriogram  Past Medical History:  Diagnosis Date  . CAD (coronary artery disease)   . Carotid artery occlusion   . Diabetes mellitus without complication (HCC)   . Diverticulitis   . Dizziness   . Gout   . Hyperlipemia   . Hypertension   . Peripheral vascular disease [facility])     Past Surgical History:  Procedure Laterality Date  . BACK SURGERY  2012  . CARDIAC SURGERY  1998   Bypass  . FEMORAL ARTERY STENT      Allergies: Ace inhibitors and Prednisone  Medications: Prior to Admission medications   Medication Sig Start Date End Date Taking? Authorizing Provider  amLODipine (NORVASC) 10 MG tablet Take 10 mg by mouth daily.  02/25/16  Yes [provider]  aspirin EC 81 MG tablet Take 81 mg by mouth daily.   Yes [provider]  atorvastatin (LIPITOR) 80 MG tablet Take 80 mg by mouth every evening.  02/25/16  Yes [provider]  chlorthalidone (HYGROTON) 25 MG tablet Take 12.5 mg by mouth every morning.  02/25/16 02/24/17 Yes [provider]  clopidogrel (PLAVIX) 75 MG tablet Take 75 mg by mouth daily.  02/25/16  Yes [provider]  ezetimibe (ZETIA) 10 MG tablet Take 10 mg by mouth daily with lunch.  02/25/16 02/24/17 Yes [provider]  losartan (COZAAR) 100 MG tablet Take 100 mg by mouth daily.  02/25/16  Yes [provider]  metFORMIN (GLUCOPHAGE) 500 MG tablet Take 500 mg by mouth 2 (two) times daily with a meal.  02/25/16 02/24/17 Yes [provider]  Multiple Vitamin (MULTI VITAMIN DAILY PO) Take 1 tablet by mouth daily.    Yes [provider]  Omega-3 Fatty Acids (FISH OIL) 1000 MG CAPS Take 1 capsule by mouth 2 (two) times daily.    Yes [provider]     Family History  Problem Relation Age of Onset  . Lung disease Mother   . Heart attack Father     Social History   Social History  . Marital status: Unknown    Spouse name: N/A  . Number of children: 3  . Years of education: 12th grade   Occupational History  . Retired    Social History Main Topics  . Smoking status: Former Games developer  . Smokeless tobacco: Never Used     Comment: 1998  . Alcohol use No     Comment: 1998  . Drug use: No  . Sexual activity: Not Asked   Other Topics Concern  . None   Social History Narrative   Lives at home with daughter.  Right-handed.   1 cup caffeine daily.    Review of Systems: A 12 point ROS discussed and pertinent positives are indicated in the HPI above.  All other systems are negative.  Review of Systems  Constitutional: Negative for activity change, appetite change, fatigue and fever.  HENT: Negative for tinnitus.   Respiratory: Negative for shortness of breath.   Cardiovascular: Negative for chest pain.  Gastrointestinal: Negative for abdominal pain.  Musculoskeletal: Negative for back pain.  Neurological: Positive for dizziness. Negative for tremors, seizures, syncope, facial asymmetry, speech difficulty, weakness, light-headedness, numbness and  headaches.  Psychiatric/Behavioral: Negative for behavioral problems and confusion.    Vital Signs: BP 136/65   Pulse (!) 52   Temp 98.1 F (36.7 C) (Oral)   Resp 16   Ht 6' (1.829 m)   Wt 193 lb (87.5 kg)   SpO2 98%   BMI 26.18 kg/m   Physical Exam  Constitutional: He is oriented to person, place, and time.  Cardiovascular: Normal rate and regular rhythm.   Murmur heard. Pulmonary/Chest: Effort normal.  Abdominal: Soft. Bowel sounds are normal.  Musculoskeletal: Normal range of motion.  Neurological: He is alert and oriented to person, place, and time.  Skin: Skin is warm and dry.  Psychiatric: He has a normal mood and affect. His behavior is normal. Judgment and thought content normal.  Nursing note and vitals reviewed.   Imaging: Ct Angio Neck W Or Wo Contrast  Result Date: 10/17/2016 CLINICAL DATA:  Dizziness for 1.5 years.  Carotid artery stenosis. Creatinine was obtained on site at [facility] at [address]Results: Creatinine 0.9 mg/dL. EXAM: CT ANGIOGRAPHY NECK TECHNIQUE: Multidetector CT imaging of the neck was performed using the standard protocol during bolus administration of intravenous contrast. Multiplanar CT image reconstructions and MIPs were obtained to evaluate the vascular anatomy. Carotid stenosis measurements (when applicable) are obtained utilizing NASCET criteria, using the distal internal carotid diameter as the denominator. CONTRAST:  80 mL Isovue 370 COMPARISON:  None. FINDINGS: Aortic arch: Standard 3 vessel aortic arch with extensive atherosclerotic plaque. No arch vessel origin stenosis. Streak artifact from venous contrast partially obscures the distal brachiocephalic and proximal right subclavian arteries. Right carotid system: Age advanced, predominantly calcified atherosclerotic plaque throughout the distal common and proximal external carotid arteries. No significant common carotid artery stenosis. Mild ECA narrowing. Milder calcified  plaque in the proximal ICA without significant stenosis. Left carotid system: Moderate to advanced calcified plaque in the distal common carotid artery without significant stenosis. Mixed calcified and soft plaque in the proximal ICA results in 75% stenosis. Vertebral arteries: The left vertebral artery is occluded at its origin with reconstitution distally at the C4 level. The left vertebral artery is patent from this point to the vertebrobasilar junction, although it is somewhat small in caliber with additional superimposed mild V4 segment stenosis. The right vertebral artery origin and V1 segment are obscured by streak artifact from refluxed venous contrast. The right V2 segment is patent but is poorly opacified with a thready appearance. There is occlusion of the right vertebral artery at the C1-2 level with reconstitution just proximal to the V3-V4 junction. The right V4 segment is grossly patent but demonstrates multifocal stenoses and poor opacification. Skeleton: Mild cervical disc degeneration. Other neck: Subcentimeter low-density thyroid nodules bilaterally. Upper chest: Clear lung apices. IMPRESSION: 1. Age advanced atherosclerosis including Aortic Atherosclerosis (ICD10-I70.0). 2. 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 3. Poor opacification of the right vertebral artery with V3  segment occlusion. Right V1 segment obscured by streak artifact. 4. 75% proximal left ICA stenosis. 5. No significant stenosis of the right ICA or of either common carotid artery. Electronically Signed   By: Sebastian Ache M.D.   On: 10/17/2016 10:49    Labs:  CBC: No results for input(s): WBC, HGB, HCT, PLT in the last 8760 hours.  COAGS: No results for input(s): INR, APTT in the last 8760 hours.  BMP: No results for input(s): NA, K, CL, CO2, GLUCOSE, BUN, CALCIUM, CREATININE, GFRNONAA, GFRAA in the last 8760 hours.  Invalid input(s): CMP  LIVER FUNCTION TESTS: No results for  input(s): BILITOT, AST, ALT, ALKPHOS, PROT, ALBUMIN in the last 8760 hours.  TUMOR MARKERS: No results for input(s): AFPTM, CEA, CA199, CHROMGRNA in the last 8760 hours.  Assessment and Plan:  Dizziness over 1 yr Known L ICA stenosis For cerebral arteriogram for full evaluation  Risks and benefits of cerebral arteriogram were discussed with the patient including, but not limited to bleeding, infection, vascular injury, contrast induced renal failure, stroke or even death. This interventional procedure involves the use of X-rays and because of the nature of the planned procedure, it is possible that we will have prolonged use of X-ray fluoroscopy. Potential radiation risks to you include (but are not limited to) the following: - A slightly elevated risk for cancer  several years later in life. This risk is typically less than 0.5% percent. This risk is low in comparison to the normal incidence of human cancer, which is 33% for women and 50% for men according to the American Cancer Society. - Radiation induced injury can include skin redness, resembling a rash, tissue breakdown / ulcers and hair loss (which can be temporary or permanent).  The likelihood of either of these occurring depends on the difficulty of the procedure and whether you are sensitive to radiation due to previous procedures, disease, or genetic conditions.  IF your procedure requires a prolonged use of radiation, you will be notified and given written instructions for further action.  It is your responsibility to monitor the irradiated area for the 2 weeks following the procedure and to notify your physician if you are concerned that you have suffered a radiation induced injury.    All of the patient's questions were answered, patient is agreeable to proceed. Consent signed and in chart.  Thank you for this interesting consult.  I greatly enjoyed meeting Glenn Rollins and look forward to participating in their care.   A copy of this report was sent to the requesting provider on this date.  Electronically Signed: Robet Leu, PA-C 11/15/2016, 7:14 AM   I spent a total of  30 Minutes   in face to face in clinical consultation, greater than 50% of which was counseling/coordinating care for cerebral arteriogram

## 2016-11-15 NOTE — Sedation Documentation (Signed)
Right groin sheath removed. V-Pad applied 

## 2016-11-16 ENCOUNTER — Ambulatory Visit (HOSPITAL_COMMUNITY): Payer: Medicare Other

## 2016-11-16 ENCOUNTER — Encounter (HOSPITAL_COMMUNITY): Payer: Self-pay | Admitting: Interventional Radiology

## 2016-11-16 ENCOUNTER — Encounter: Payer: Self-pay | Admitting: Vascular Surgery

## 2016-11-25 ENCOUNTER — Telehealth: Payer: Self-pay | Admitting: *Deleted

## 2016-12-01 ENCOUNTER — Telehealth: Payer: Self-pay | Admitting: Vascular Surgery

## 2016-12-01 NOTE — Telephone Encounter (Signed)
Discussed recent arteriogram with the patient. This was a cerebral arteriogram from 11/15/2016. He had discussed this with Dr. Corliss Rollins and I had misunderstood that there was no other communication needed. Glenn Rollins called our office requesting further discussion. Spoke with him today. Did explain that he does have occlusion of both vertebral arteries with no treatment option. Explained that this is all likelihood is causing his dizziness with no treatment available. Explained that he needs to continue to arise slowly and equilibrate prior to walking. He does have a 60% left internal carotid artery stenosis at the bifurcation. We will see him again in one year with a carotid duplex follow-up. He will notify should he develop any change in symptoms

## 2016-12-15 NOTE — Telephone Encounter (Signed)
Error

## 2017-03-14 ENCOUNTER — Encounter (HOSPITAL_COMMUNITY): Payer: Medicare Other

## 2017-03-14 ENCOUNTER — Ambulatory Visit: Payer: Medicare Other | Admitting: Family

## 2017-10-17 ENCOUNTER — Encounter (HOSPITAL_COMMUNITY): Payer: Medicare Other

## 2017-10-17 ENCOUNTER — Ambulatory Visit: Payer: Medicare Other | Admitting: Vascular Surgery

## 2017-11-13 IMAGING — CT CT ANGIO NECK
1 of 3 series · 3 of 14 positions shown · IV contrast (APPLIED)
Comparison: None.

CLINICAL DATA: Dizziness for 1.5 years.  Carotid artery stenosis.

Creatinine was obtained on site at [HOSPITAL] at [HOSPITAL].Results: Creatinine 0.9 mg/dL.
EXAM:
CT ANGIOGRAPHY NECK
TECHNIQUE: Multidetector CT imaging of the neck was performed using the
standard protocol during bolus administration of intravenous
contrast. Multiplanar CT image reconstructions and MIPs were
obtained to evaluate the vascular anatomy. Carotid stenosis
measurements (when applicable) are obtained utilizing NASCET
criteria, using the distal internal carotid diameter as the
denominator.
CONTRAST:  80 mL Isovue 370

[Series 5: carotid angio · axial · 0.37mm/px · z∈[-324,-54]mm · 3 of 91 slices shown]
[im 1/91  soft-tissue]
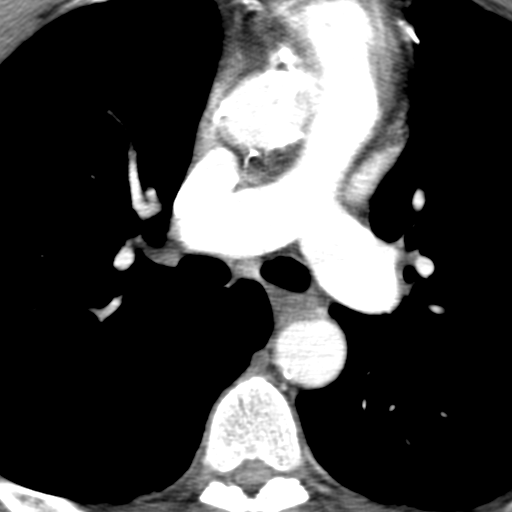
[im 46/91  bone]
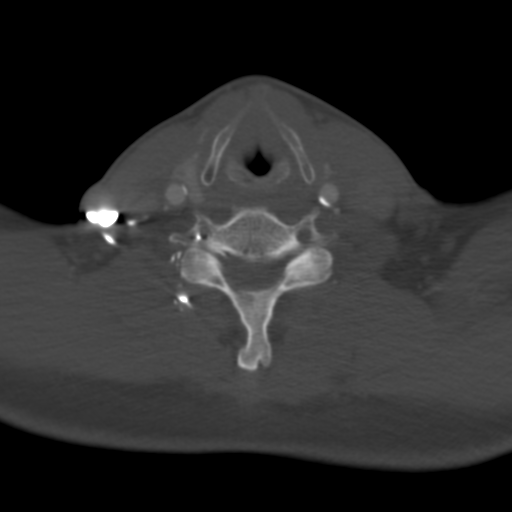
[im 91/91  soft-tissue]
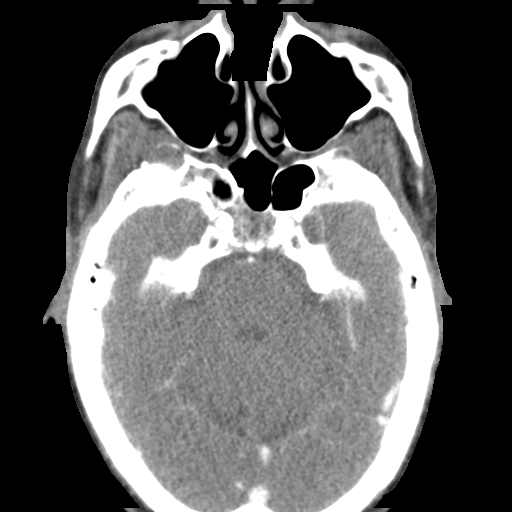

[3 of 14 positions shown; findings below may reference images not displayed]

FINDINGS: Aortic arch: Standard 3 vessel aortic arch with extensive
atherosclerotic plaque. No arch vessel origin stenosis. Streak
artifact from venous contrast partially obscures the distal
brachiocephalic and proximal right subclavian arteries.

Right carotid system: Age advanced, predominantly calcified
atherosclerotic plaque throughout the distal common and proximal
external carotid arteries. No significant common carotid artery
stenosis. Mild ECA narrowing. Milder calcified plaque in the
proximal ICA without significant stenosis.

Left carotid system: Moderate to advanced calcified plaque in the
distal common carotid artery without significant stenosis. Mixed
calcified and soft plaque in the proximal ICA results in 75%
stenosis.

Vertebral arteries: The left vertebral artery is occluded at its
origin with reconstitution distally at the C4 level. The left
vertebral artery is patent from this point to the vertebrobasilar
junction, although it is somewhat small in caliber with additional
superimposed mild V4 segment stenosis. The right vertebral artery
origin and V1 segment are obscured by streak artifact from refluxed
venous contrast. The right V2 segment is patent but is poorly
opacified with a thready appearance. There is occlusion of the right
vertebral artery at the C1-2 level with reconstitution just proximal
to the V3-V4 junction. The right V4 segment is grossly patent but
demonstrates multifocal stenoses and poor opacification.

Skeleton: Mild cervical disc degeneration.

Other neck: Subcentimeter low-density thyroid nodules bilaterally.

Upper chest: Clear lung apices.
IMPRESSION: 1. Age advanced atherosclerosis including Aortic Atherosclerosis
([id number]-SWL.L).
2. Left vertebral artery occlusion at its origin with distal
reconstitution.
3. Poor opacification of the right vertebral artery with V3 segment
occlusion. Right V1 segment obscured by streak artifact.
4. 75% proximal left ICA stenosis.
5. No significant stenosis of the right ICA or of either common
carotid artery.

## 2017-12-11 IMAGING — XA IR ANGIO INTRA EXTRACRAN SEL COM CAROTID INNOMINATE BILAT MOD SE
1 series · 11 of 24 positions shown · IV contrast (IODINE)
Comparison: CT angiogram of the head and neck of 10/17/2016.

CLINICAL DATA: Vertebrobasilar insufficiency. Abnormal CT angiogram
of the head and neck.

EXAM:
BILATERAL COMMON CAROTID AND INNOMINATE ANGIOGRAPHY; IR BILATERAL
ANGIO VERTERBRAL SELECTIVE SUBCALVIAN INNOMNATE WITH MODERATE
SEDATION
TECHNIQUE: Informed written consent was obtained from the patient after a
thorough discussion of the procedural risks, benefits and
alternatives. All questions were addressed. Maximal Sterile Barrier
Technique was utilized including caps, mask, sterile gowns, sterile
gloves, sterile drape, hand hygiene and skin antiseptic. A timeout
was performed prior to the initiation of the procedure.

[Series 300: dr. (person_name) · 11 of 135 slices shown]
[im 6/135]
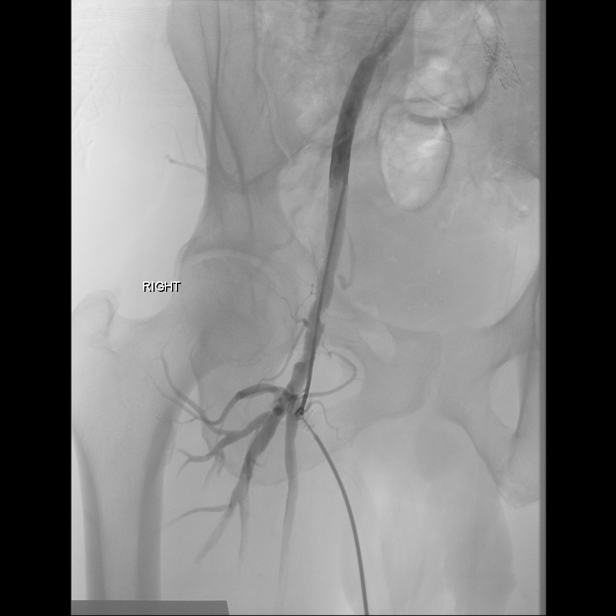
[im 18/135]
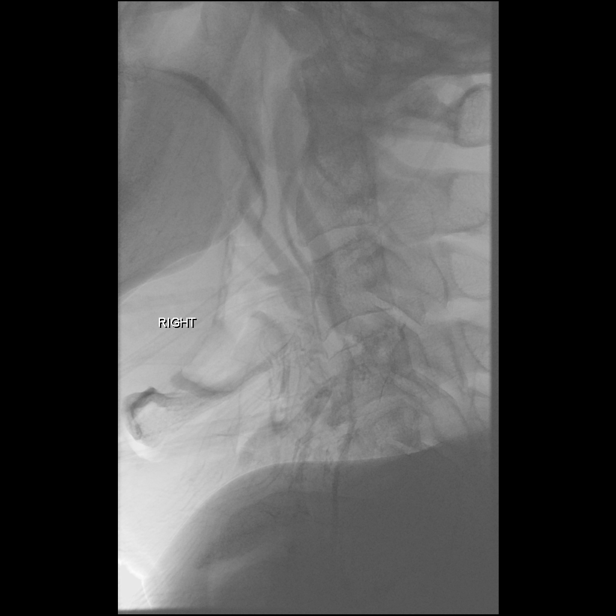
[im 30/135]
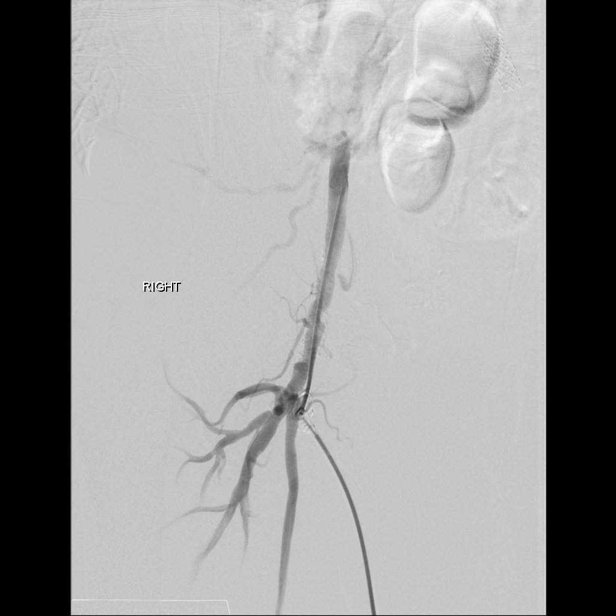
[im 41/135]
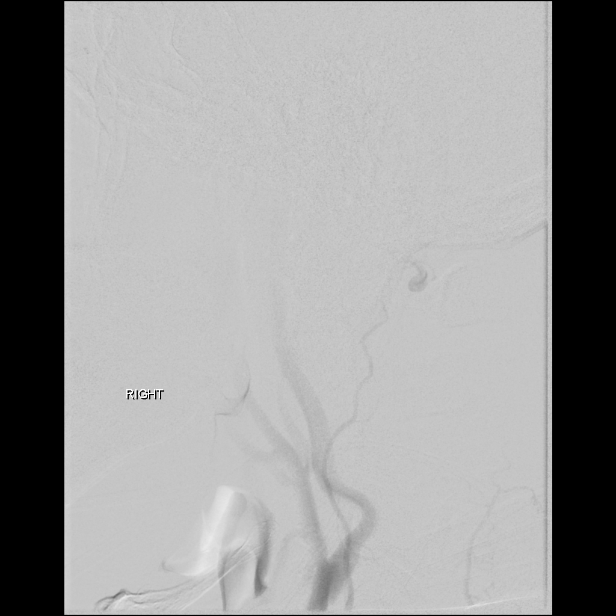
[im 53/135]
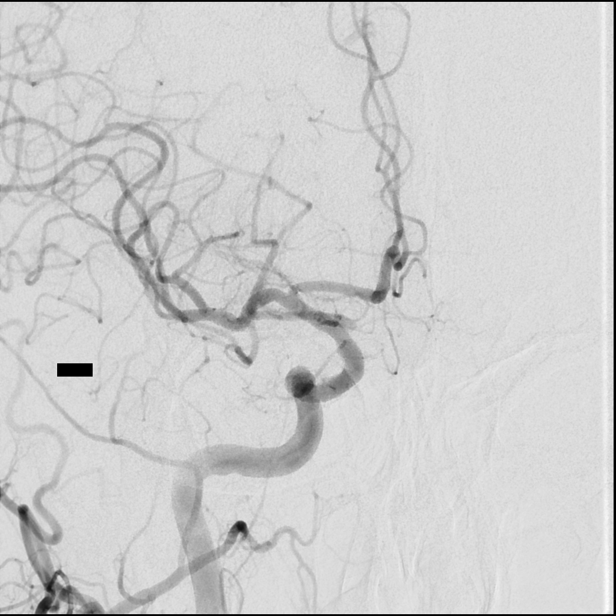
[im 70/135]
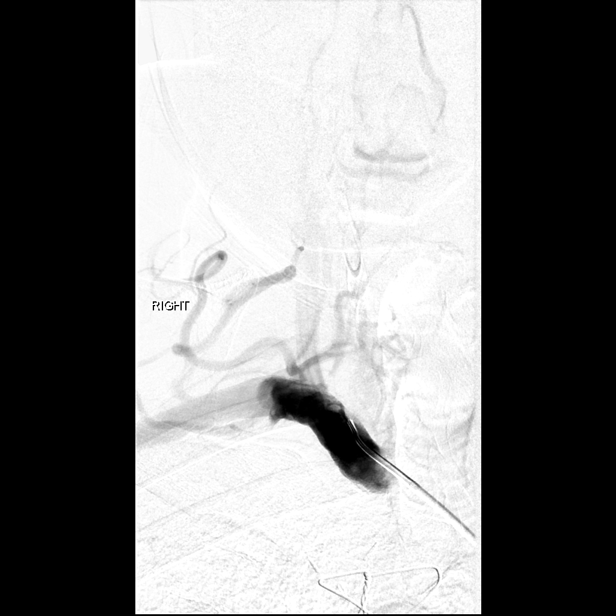
[im 82/135]
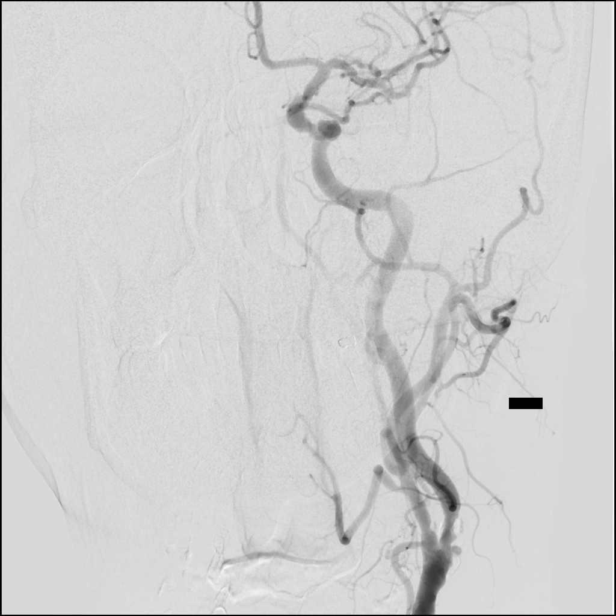
[im 94/135]
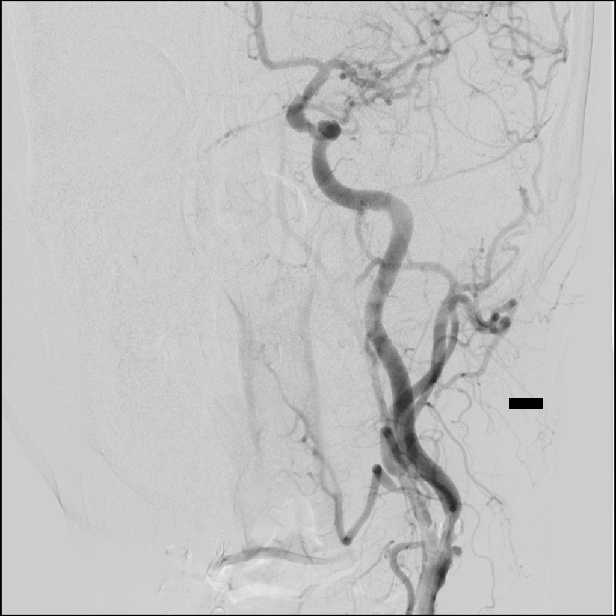
[im 105/135]
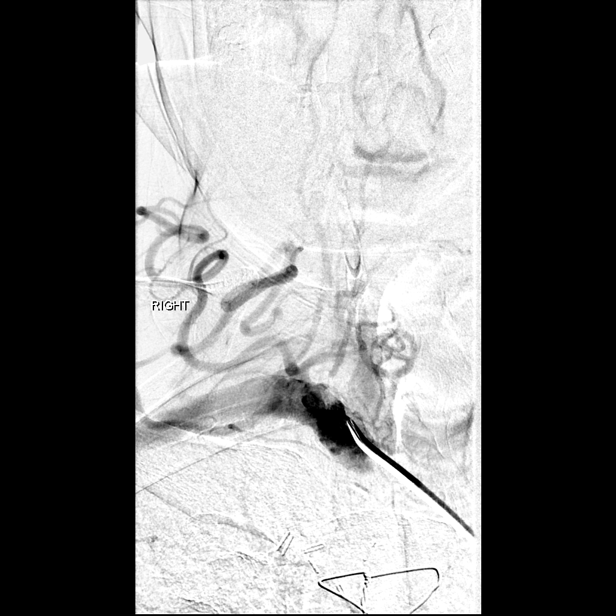
[im 117/135]
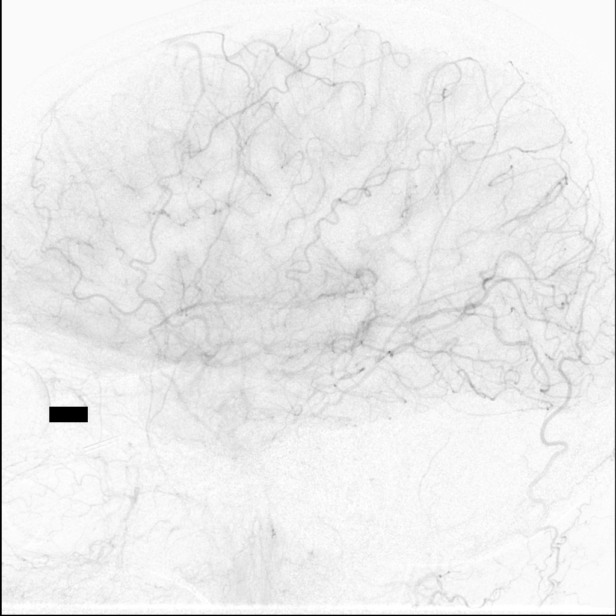
[im 129/135]
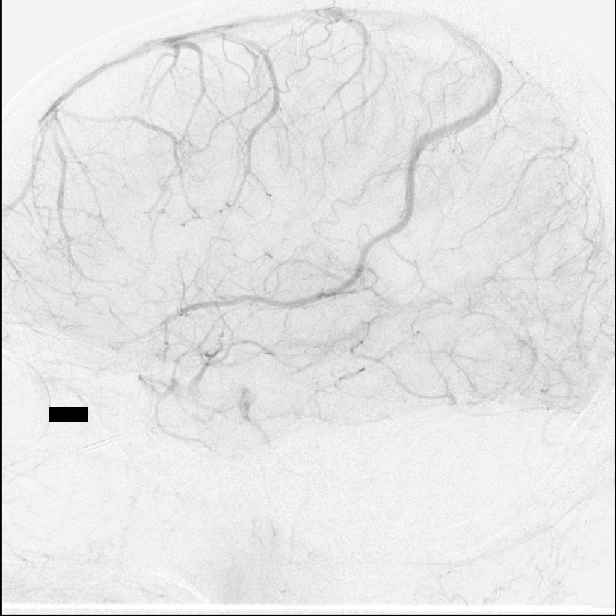

[11 of 24 positions shown; findings below may reference images not displayed]

MEDICATIONS:
Heparin 8555 units IV; no antibiotic was administered within 1 hour
of the procedure.

ANESTHESIA/SEDATION:
Versed 1 mg IV; Fentanyl 20 mcg IV.

Moderate Sedation Time:  34 minutes.

The patient was continuously monitored during the procedure by the
interventional radiology nurse under my direct supervision.

CONTRAST:  Isovue 300 approximately 60 mL.

FLUOROSCOPY TIME:  Fluoroscopy Time: 8 minutes 12 seconds (4439
mGy).

COMPLICATIONS:
None immediate.
The right groin was prepped and draped in the usual sterile fashion.
Thereafter using modified Seldinger technique, transfemoral access
into the right common femoral artery was obtained without
difficulty. Over a 0.035 inch guidewire, a 5 French Pinnacle sheath
was inserted. Through this, and also over 0.035 inch guidewire, a 5
French JB 1 catheter was advanced to the aortic arch region and
selectively positioned in the right common carotid artery, , the
right subclavian artery, the left common carotid artery and the left
subclavian artery.
FINDINGS: The right common carotid arteriogram demonstrates a smooth
atherosclerotic plaque at the posterior aspect of the right common
carotid artery at the bifurcation.

The right external carotid artery and its major branches are widely
patent.

The right internal carotid artery just distal to the bulb has a
segmental area of narrowing of approximately 20% by the NASCET
criteria.

The vessel distal to this opacifies normally to the cranial skull
base.

The petrous segment is widely patent.

There is mild stenosis at the petrous cavernous and the caval
cavernous segments.

A supraclinoid right ICA demonstrates wide patency.

A right posterior communicating artery is seen opacifying the right
posterior cerebral and transiently the left posterior cerebral
arteries.

The right middle cerebral artery proximally demonstrates mild
arteriosclerotic irregularity without associated significant
stenosis. The right middle cerebral artery branches, otherwise,
opacify into the capillary and venous phases. There is mild stenosis
of the right anterior cerebral artery at its origin.

Distal to this the vessel is seen to opacify normally into the
capillary and venous phases.

The right subclavian arteriogram demonstrates angiographically
occluded right vertebral artery at the level of C5-C6. Heavy
calcification is seen at its origin.

Distally there is partial reconstitution of the right vertebral
artery at the level of C1 from the ascending branch of the
thyrocervical trunk, and also from the right occipital artery.
Opacification of the right posterior inferior cerebellar artery is
noted.

The vertebrobasilar junction distal to this, however, is occluded.

The left common carotid arteriogram demonstrates mild stenosis at
the origin of the left external carotid artery. It's branches,
however, opacify normally.

The left internal carotid artery at the bulb demonstrates
approximately 60% stenosis by the NASCET criteria. This is
associated with two ulcerations along the posterior wall of the left
internal carotid artery.

Distal to this the left internal carotid artery is seen to opacify
to the cranial skull base.

The petrous segment is widely patent.

There is mild stenosis at the petrous cavernous junction, and a 50%
stenosis of the caval cavernous segment.

There is also mild stenosis of the supraclinoid left ICA. The left
middle cerebral artery and the left anterior cerebral artery opacify
into the capillary and venous phases.

There is approximately 50% stenosis of the proximal segment of the
superior division of the left middle cerebral artery.

Also demonstrated is opacification of the left vertebrobasilar
junction, the basilar artery, the left posterior cerebral artery,
the superior cerebellar arteries and the anterior-inferior
cerebellar arteries. Opacification of the anterior-inferior
cerebellar arteries is also noted.

This is from the ipsilateral left occipital artery, and the
posterior division of the ascending pharyngeal branch of the left
external carotid artery.

The left subclavian arteriogram demonstrates angiographically
completely occluded left vertebral artery proximally.

There is partial distal reconstitution of the left vertebral artery
at the level of C5 from the ipsilateral ascending cervical branch of
the left thyrocervical trunk.

Distal to this, the left vertebral artery and left vertebrobasilar
junction opacify. Also seen is opacification of the left posterior
inferior cerebellar artery and the left vertebrobasilar junction.
Opacification of the distal basilar artery to the level of the
superior cerebellar arteries is also seen.
IMPRESSION: Angiographically occluded right vertebral artery at the C5 level
with distal reconstitution from the ascending cervical branch of the
right thyrocervical trunk, to the opacification of the right
posterior inferior cerebellar artery. Occluded right vertebrobasilar
junction distal to this.

Occluded left vertebral artery at its origin, with the partial
distal reconstitution from the ascending cervical branch of the left
thyrocervical trunk at the level of C5, with distal reinforcement
from the left occipital artery, and the posterior branch of the
ascending pharyngeal branch of the left external carotid artery.

Approximately 60% stenosis of the left internal carotid artery at
the bulb by the NASCET criteria, associated with two ulcerations.

Patency of the right posterior communicating artery opacifying
primarily the right posterior cerebral artery, and to a lesser
degree the left posterior cerebral artery.

PLAN:
PLAN
As per referring physician

## 2017-12-19 ENCOUNTER — Encounter: Payer: Self-pay | Admitting: *Deleted

## 2017-12-19 ENCOUNTER — Other Ambulatory Visit: Payer: Self-pay

## 2017-12-19 ENCOUNTER — Other Ambulatory Visit: Payer: Self-pay | Admitting: *Deleted

## 2017-12-19 ENCOUNTER — Ambulatory Visit (HOSPITAL_COMMUNITY)
Admission: RE | Admit: 2017-12-19 | Discharge: 2017-12-19 | Disposition: A | Discharge status: Home / Self Care | Payer: Federal, State, Local not specified - PPO | Source: Ambulatory Visit | Attending: Vascular Surgery | Admitting: Vascular Surgery

## 2017-12-19 ENCOUNTER — Encounter: Payer: Self-pay | Admitting: Vascular Surgery

## 2017-12-19 ENCOUNTER — Ambulatory Visit (INDEPENDENT_AMBULATORY_CARE_PROVIDER_SITE_OTHER): Payer: Federal, State, Local not specified - PPO | Admitting: Vascular Surgery

## 2017-12-19 VITALS — BP 146/62 | HR 52 | Resp 16 | Ht 72.0 in | Wt 196.4 lb

## 2017-12-19 DIAGNOSIS — R42 Dizziness and giddiness: Secondary | ICD-10-CM

## 2017-12-19 DIAGNOSIS — I6523 Occlusion and stenosis of bilateral carotid arteries: Secondary | ICD-10-CM | POA: Insufficient documentation

## 2017-12-19 NOTE — Progress Notes (Signed)
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 male, who is here today for follow-up.  He is here with his wife since they were recently married.  He continues to have chronic dizziness but reports that he is become accustomed to this and learns to arise slowly.  He has had no focal neurologic deficits.  Past Medical History:  Diagnosis Date  . CAD (coronary artery disease)   . Carotid artery occlusion   . Diabetes mellitus without complication (HCC)   . Diverticulitis   . Dizziness   . Gout   . Hyperlipemia   . Hypertension   . Peripheral vascular disease (HCC)     Family History  Problem Relation Age of Onset  . Lung disease Mother   . Heart attack Father     SOCIAL HISTORY: Social History   Socioeconomic History  . Marital status: Unknown    Spouse name: Not on file  . Number of children: 3  . Years of education: 12th grade  . Highest education level: Not on file  Occupational History  . Occupation: Retired  Engineer, production  . Financial resource strain: Not on file  . Food insecurity:    Worry: Not on file    Inability: Not on file  . Transportation needs:    Medical: Not on file    Non-medical: Not on file  Tobacco Use  . Smoking status: Former Games developer  . Smokeless tobacco: Never Used  . Tobacco comment: 1998  Substance and Sexual Activity  . Alcohol use: No    Comment: 1998  . Drug use: No  . Sexual activity: Not on file  Lifestyle  . Physical activity:    Days per week: Not on file    Minutes per session: Not on file  . Stress: Not on file  Relationships  . Social connections:    Talks on phone: Not on file    Gets together: Not on file    Attends religious service: Not on file    Active member of club or  organization: Not on file    Attends meetings of clubs or organizations: Not on file    Relationship status: Not on file  . Intimate partner violence:    Fear of current or ex partner: Not on file    Emotionally abused: Not on file    Physically abused: Not on file    Forced sexual activity: Not on file  Other Topics Concern  . Not on file  Social History Narrative   Lives at home with daughter.   Right-handed.   1 cup caffeine daily.    Allergies  Allergen Reactions  . Ace Inhibitors   . Prednisone     BP went up    Current Outpatient Medications  Medication Sig Dispense Refill  . amLODipine (NORVASC) 10 MG tablet Take 10 mg by mouth daily.     Marland Kitchen aspirin EC 81 MG tablet Take 81 mg by mouth daily.    Marland Kitchen atorvastatin (LIPITOR) 80 MG tablet Take 80 mg by mouth every evening.     . clopidogrel (PLAVIX) 75 MG tablet Take 75 mg by mouth daily.     Marland Kitchen losartan (COZAAR) 100 MG tablet Take 100 mg by mouth daily.     . Multiple Vitamin (MULTI VITAMIN DAILY PO) Take 1  tablet by mouth daily.     . Omega-3 Fatty Acids (FISH OIL) 1000 MG CAPS Take 1 capsule by mouth 2 (two) times daily.     . chlorthalidone (HYGROTON) 25 MG tablet Take 12.5 mg by mouth every morning.     . ezetimibe (ZETIA) 10 MG tablet Take 10 mg by mouth daily with lunch.     . metFORMIN (GLUCOPHAGE) 500 MG tablet Take 500 mg by mouth 2 (two) times daily with a meal.      No current facility-administered medications for this visit.     REVIEW OF SYSTEMS:  [X]  denotes positive finding, [ ]  denotes negative finding Cardiac  Comments:  Chest pain or chest pressure:    Shortness of breath upon exertion:    Short of breath when lying flat:    Irregular heart rhythm:        Vascular    Pain in calf, thigh, or hip brought on by ambulation:    Pain in feet at night that wakes you up from your sleep:     Blood clot in your veins:    Leg swelling:         Pulmonary    Oxygen at home:    Productive cough:       Wheezing:         Neurologic    Sudden weakness in arms or legs:     Sudden numbness in arms or legs:     Sudden onset of difficulty speaking or slurred speech:    Temporary loss of vision in one eye:     Problems with dizziness:  x       Gastrointestinal    Blood in stool:     Vomited blood:         Genitourinary    Burning when urinating:     Blood in urine:         PHYSICAL EXAM: Vitals:   12/19/17 1159 12/19/17 1200  BP: (!) 150/67 (!) 146/62  Pulse: (!) 52   Resp: 16   SpO2: 100%   Weight: 196 lb 6.4 oz (89.1 kg)   Height: 6' (1.829 m)     GENERAL: The patient is a well-nourished male, in no acute distress. The vital signs are documented above. CARDIOVASCULAR: Does have a left carotid bruit and no bruit on the right PULMONARY: There is good air exchange  ABDOMEN: Soft and non-tender  MUSCULOSKELETAL: There are no major deformities or cyanosis. NEUROLOGIC: No focal weakness or paresthesias are detected. SKIN: There are no ulcers or rashes noted. PSYCHIATRIC: The patient has a normal affect.  DATA:  Carotid duplex today reveals progression to severe greater than 80% stenosis in his left internal carotid artery.  Moderate right carotid stenosis.  MEDICAL ISSUES: I discussed the progression of his left carotid this with the patient and his wife.  He is right-handed.  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  I have recommended endarterectomy for reduction of stroke risk.  I explained the procedure in detail to include a 1 to 2% risk of stroke with surgery and also the rare event of cranial nerve injury bleeding and infection.  I explained that typically would not expect to have any improvement in dizziness with surgery on the anterior circulation but with his known bilateral vertebral artery occlusion, he may have some improvement to generalized cerebral blood flow.  He understands and wishes to proceed at his earliest convenience   Kristen Loader.  Emillio Ngo,  MD FACS [facility] Tel [phone] Pager [phone]

## 2017-12-19 NOTE — Progress Notes (Signed)
Patient called and instructed to hold Plavix for 5 days pre-op per Dr. Arbie Cookey.

## 2018-01-01 NOTE — Pre-Procedure Instructions (Addendum)
Glenn Rollins  01/01/2018      [id number] - [place], [place] - [address] AT [facility] [address] [place] [address] Phone: [phone] Fax: [phone]    Your procedure is scheduled on Wednesday, November 20th.  Report to [facility] Admitting at 9:30 A.M.  Call this number if you have problems the morning of surgery:  [phone]   Remember:  Do not eat or drink after midnight.    Take these medicines the morning of surgery with A SIP OF WATER  amLODipine (NORVASC)  Follow your surgeon's instructions on when to stop/resume Asprin and Plavix.    Stop Plavix 5 days prior to surgery (01/05/18) per Dr. Arbie Cookey.  7 days prior to surgery STOP taking any diclofenac (voltaren) gel, Aleve, Naproxen, Ibuprofen, Motrin, Advil, Goody's, BC's, all herbal medications, fish oil, and all vitamins.     WHAT DO I DO ABOUT MY DIABETES MEDICATION?   Marland Kitchen Do not take oral diabetes medicines (pills) the morning of surgery. DO NOT TAKE meTFORMIN (GLUCOPHAGE)   How to Manage Your Diabetes Before and After Surgery  Why is it important to control my blood sugar before and after surgery? . Improving blood sugar levels before and after surgery helps healing and can limit problems. . A way of improving blood sugar control is eating a healthy diet by: o  Eating less sugar and carbohydrates o  Increasing activity/exercise o  Talking with your doctor about reaching your blood sugar goals . High blood sugars (greater than 180 mg/dL) can raise your risk of infections and slow your recovery, so you will need to focus on controlling your diabetes during the weeks before surgery. . Make sure that the doctor who takes care of your diabetes knows about your planned surgery including the date and location.  How do I manage my blood sugar before surgery? . Check your blood sugar at least 4 times a day, starting 2 days before surgery, to make sure that the  level is not too high or low. o Check your blood sugar the morning of your surgery when you wake up and every 2 hours until you get to the Short Stay unit. . If your blood sugar is less than 70 mg/dL, you will need to treat for low blood sugar: o Do not take insulin. o Treat a low blood sugar (less than 70 mg/dL) with  cup of clear juice (cranberry or apple), 4 glucose tablets, OR glucose gel. o Recheck blood sugar in 15 minutes after treatment (to make sure it is greater than 70 mg/dL). If your blood sugar is not greater than 70 mg/dL on recheck, call [phone] for further instructions. . Report your blood sugar to the short stay nurse when you get to Short Stay.  . If you are admitted to the hospital after surgery: o Your blood sugar will be checked by the staff and you will probably be given insulin after surgery (instead of oral diabetes medicines) to make sure you have good blood sugar levels. o The goal for blood sugar control after surgery is 80-180 mg/dL.     Do not wear jewelry, make-up or nail polish.  Do not wear lotions, powders, or perfumes, or deodorant.  Do not shave 48 hours prior to surgery.  Men may shave face and neck.  Do not bring valuables to the hospital.  [facility] is not responsible for any belongings or valuables.  Contacts, dentures or  bridgework may not be worn into surgery.  Leave your suitcase in the car.  After surgery it may be brought to your room.  For patients admitted to the hospital, discharge time will be determined by your treatment team.  Patients discharged the day of surgery will not be allowed to drive home.   Special instructions:   [place]- Preparing For Surgery  Before surgery, you can play an important role. Because skin is not sterile, your skin needs to be as free of germs as possible. You can reduce the number of germs on your skin by washing with CHG (chlorahexidine gluconate) Soap before surgery.  CHG is an antiseptic cleaner  which kills germs and bonds with the skin to continue killing germs even after washing.    Oral Hygiene is also important to reduce your risk of infection.  Remember - BRUSH YOUR TEETH THE MORNING OF SURGERY WITH YOUR REGULAR TOOTHPASTE  Please do not use if you have an allergy to CHG or antibacterial soaps. If your skin becomes reddened/irritated stop using the CHG.  Do not shave (including legs and underarms) for at least 48 hours prior to first CHG shower. It is OK to shave your face.  Please follow these instructions carefully.   1. Shower the NIGHT BEFORE SURGERY and the MORNING OF SURGERY with CHG.   2. If you chose to wash your hair, wash your hair first as usual with your normal shampoo.  3. After you shampoo, rinse your hair and body thoroughly to remove the shampoo.  4. Use CHG as you would any other liquid soap. You can apply CHG directly to the skin and wash gently with a scrungie or a clean washcloth.   5. Apply the CHG Soap to your body ONLY FROM THE NECK DOWN.  Do not use on open wounds or open sores. Avoid contact with your eyes, ears, mouth and genitals (private parts). Wash Face and genitals (private parts)  with your normal soap.  6. Wash thoroughly, paying special attention to the area where your surgery will be performed.  7. Thoroughly rinse your body with warm water from the neck down.  8. DO NOT shower/wash with your normal soap after using and rinsing off the CHG Soap.  9. Pat yourself dry with a CLEAN TOWEL.  10. Wear CLEAN PAJAMAS to bed the night before surgery, wear comfortable clothes the morning of surgery  11. Place CLEAN SHEETS on your bed the night of your first shower and DO NOT SLEEP WITH PETS.    Day of Surgery:  Do not apply any deodorants/lotions.  Please wear clean clothes to the hospital/surgery center.   Remember to brush your teeth WITH YOUR REGULAR TOOTHPASTE.  Please read over the following fact sheets that you were  given.

## 2018-01-02 ENCOUNTER — Encounter (HOSPITAL_COMMUNITY)
Admission: RE | Admit: 2018-01-02 | Discharge: 2018-01-02 | Disposition: A | Discharge status: Home / Self Care | Payer: Federal, State, Local not specified - PPO | Source: Ambulatory Visit | Attending: Vascular Surgery | Admitting: Vascular Surgery

## 2018-01-02 ENCOUNTER — Other Ambulatory Visit: Payer: Self-pay

## 2018-01-02 ENCOUNTER — Encounter (HOSPITAL_COMMUNITY): Payer: Self-pay

## 2018-01-02 DIAGNOSIS — Z01812 Encounter for preprocedural laboratory examination: Secondary | ICD-10-CM | POA: Insufficient documentation

## 2018-01-02 DIAGNOSIS — M109 Gout, unspecified: Secondary | ICD-10-CM | POA: Diagnosis not present

## 2018-01-02 DIAGNOSIS — Z951 Presence of aortocoronary bypass graft: Secondary | ICD-10-CM | POA: Insufficient documentation

## 2018-01-02 DIAGNOSIS — I10 Essential (primary) hypertension: Secondary | ICD-10-CM | POA: Diagnosis not present

## 2018-01-02 DIAGNOSIS — Z79899 Other long term (current) drug therapy: Secondary | ICD-10-CM | POA: Diagnosis not present

## 2018-01-02 DIAGNOSIS — E119 Type 2 diabetes mellitus without complications: Secondary | ICD-10-CM | POA: Diagnosis not present

## 2018-01-02 DIAGNOSIS — Z7982 Long term (current) use of aspirin: Secondary | ICD-10-CM | POA: Insufficient documentation

## 2018-01-02 DIAGNOSIS — I739 Peripheral vascular disease, unspecified: Secondary | ICD-10-CM | POA: Insufficient documentation

## 2018-01-02 DIAGNOSIS — I2510 Atherosclerotic heart disease of native coronary artery without angina pectoris: Secondary | ICD-10-CM | POA: Diagnosis not present

## 2018-01-02 DIAGNOSIS — Z7984 Long term (current) use of oral hypoglycemic drugs: Secondary | ICD-10-CM | POA: Diagnosis not present

## 2018-01-02 DIAGNOSIS — I6522 Occlusion and stenosis of left carotid artery: Secondary | ICD-10-CM | POA: Insufficient documentation

## 2018-01-02 DIAGNOSIS — Z87891 Personal history of nicotine dependence: Secondary | ICD-10-CM | POA: Diagnosis not present

## 2018-01-02 DIAGNOSIS — Z7902 Long term (current) use of antithrombotics/antiplatelets: Secondary | ICD-10-CM | POA: Diagnosis not present

## 2018-01-02 HISTORY — DX: Nonrheumatic aortic (valve) stenosis: I35.0

## 2018-01-02 LAB — COMPREHENSIVE METABOLIC PANEL
ALBUMIN: 4.1 g/dL (ref 3.5–5.0)
ALK PHOS: 49 U/L (ref 38–126)
ALT: 20 U/L (ref 0–44)
ANION GAP: 6 (ref 5–15)
AST: 29 U/L (ref 15–41)
BILIRUBIN TOTAL: 0.8 mg/dL (ref 0.3–1.2)
BUN: 19 mg/dL (ref 8–23)
CALCIUM: 10.2 mg/dL (ref 8.9–10.3)
CO2: 27 mmol/L (ref 22–32)
Chloride: 107 mmol/L (ref 98–111)
Creatinine, Ser: 0.92 mg/dL (ref 0.61–1.24)
GFR calc Af Amer: >60 mL/min (ref >60)
GFR calc non Af Amer: >60 mL/min (ref >60)
GLUCOSE: 100 mg/dL — AB (ref 70–99)
POTASSIUM: 4.8 mmol/L (ref 3.5–5.1)
SODIUM: 140 mmol/L (ref 135–145)
Total Protein: 7.9 g/dL (ref 6.5–8.1)

## 2018-01-02 LAB — URINALYSIS, ROUTINE W REFLEX MICROSCOPIC
BILIRUBIN URINE: NEGATIVE
Glucose, UA: NEGATIVE mg/dL
HGB URINE DIPSTICK: NEGATIVE
Ketones, ur: NEGATIVE mg/dL
Leukocytes, UA: NEGATIVE
Nitrite: NEGATIVE
PROTEIN: NEGATIVE mg/dL
Specific Gravity, Urine: 1.018 (ref 1.005–1.030)
pH: 7 (ref 5.0–8.0)

## 2018-01-02 LAB — CBC
HEMATOCRIT: 36.3 % — AB (ref 39.0–52.0)
HEMOGLOBIN: 11.9 g/dL — AB (ref 13.0–17.0)
MCH: 32.5 pg (ref 26.0–34.0)
MCHC: 32.8 g/dL (ref 30.0–36.0)
MCV: 99.2 fL (ref 80.0–100.0)
NRBC: 0 % (ref 0.0–0.2)
Platelets: 229 10*3/uL (ref 150–400)
RBC: 3.66 MIL/uL — ABNORMAL LOW (ref 4.22–5.81)
RDW: 11.7 % (ref 11.5–15.5)
WBC: 5.9 10*3/uL (ref 4.0–10.5)

## 2018-01-02 LAB — PROTIME-INR
INR: 1.01
Prothrombin Time: 13.2 seconds (ref 11.4–15.2)

## 2018-01-02 LAB — ABO/RH: ABO/RH(D): O POS

## 2018-01-02 LAB — TYPE AND SCREEN
ABO/RH(D): O POS
ANTIBODY SCREEN: NEGATIVE

## 2018-01-02 LAB — SURGICAL PCR SCREEN
MRSA, PCR: NEGATIVE
STAPHYLOCOCCUS AUREUS: NEGATIVE

## 2018-01-02 LAB — HEMOGLOBIN A1C
Hgb A1c MFr Bld: 5.8 % — ABNORMAL HIGH (ref 4.8–5.6)
Mean Plasma Glucose: 119.76 mg/dL

## 2018-01-02 LAB — APTT: aPTT: 36 seconds (ref 24–36)

## 2018-01-02 NOTE — Progress Notes (Signed)
PCP - Dr. 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  Sleep Study - denies  Fasting Blood Sugar - 90's Checks Blood Sugar 1 time a day  Blood Thinner Instructions: Plavix; stop 5 days prior to surgery per Dr. Arbie Cookey.  Aspirin Instructions: Continue ASA per Dr. Arbie Cookey.   Anesthesia review: Yes, hx of CAD  Requested all cardiac studies from [facility] (Dr. Evlyn Kanner).   Patient denies shortness of breath, fever, cough and chest pain at PAT appointment   Patient verbalized understanding of instructions that were given to them at the PAT appointment. Patient was also instructed that they will need to review over the PAT instructions again at home before surgery.

## 2018-01-03 ENCOUNTER — Encounter (HOSPITAL_COMMUNITY): Payer: Self-pay

## 2018-01-03 NOTE — Anesthesia Preprocedure Evaluation (Addendum)
Anesthesia Evaluation  Patient identified by MRN, date of birth, ID band Patient awake    Reviewed: Allergy & Precautions, NPO status , Patient's Chart, lab work & pertinent test results  Airway Mallampati: II  TM Distance: >3 FB Neck ROM: Full    Dental  (+) Teeth Intact, Dental Advisory Given   Pulmonary former smoker,    breath sounds clear to auscultation       Cardiovascular hypertension, Pt. on medications + CAD and + Peripheral Vascular Disease   Rhythm:Regular Rate:Normal     Neuro/Psych  Neuromuscular disease    GI/Hepatic   Endo/Other  diabetes, Well Controlled, Type 2, Oral Hypoglycemic Agents  Renal/GU      Musculoskeletal   Abdominal   Peds  Hematology   Anesthesia Other Findings   Reproductive/Obstetrics                          Anesthesia Physical Anesthesia Plan  ASA: III  Anesthesia Plan: General   Post-op Pain Management:    Induction: Intravenous  PONV Risk Score and Plan: 2 and Ondansetron, Midazolam and Dexamethasone  Airway Management Planned: Oral ETT  Additional Equipment: Arterial line  Intra-op Plan:   Post-operative Plan: Possible Post-op intubation/ventilation  Informed Consent: I have reviewed the patients History and Physical, chart, labs and discussed the procedure including the risks, benefits and alternatives for the proposed anesthesia with the patient or authorized representative who has indicated his/her understanding and acceptance.   Dental advisory given  Plan Discussed with: CRNA, Anesthesiologist and Surgeon  Anesthesia Plan Comments: (PAT note written 01/03/2018 by Shonna ChockAllison Zelenak, PA-C. Addendum added 01/31/18.  )    Anesthesia Quick Evaluation

## 2018-01-03 NOTE — Progress Notes (Addendum)
Anesthesia Chart Review:  Case:  [id number] Date/Time:  01/10/18 1113   Procedure:  ENDARTERECTOMY CAROTID LEFT (Left )   Anesthesia type:  General   Pre-op diagnosis:  LEFT CAROTID ARTERY STENOSIS   Location:  [facility] ROOM 11 / [facility] OR   Surgeon:  Larina EarthlyEarly, Todd F, MD      DISCUSSION: Patient is a 67 year old male scheduled for the above procedure.  History includes former smoker, CAD (s/p 4V CABG '98), PAD (left SFA stent 2012, left CIA 2006; s/p right SFA stents 2005/2012/2017: s/p laser atherectomy right SFA ISRS/thrombus/drug coated balloon angioplasty right SFA, POBA right CFA and ostial right SFA 01/04/16), carotid artery stenosis (80-99% LICA stenosis, bilateral vertebral artery occlusion), mild aortic stenosis (06/2017).  He has been seeing his PCP Murlean CallerSouth, [place], MD since before 2012. It appears she has intermittently ordered cardiac testing such as stress (02/2014) and echo (06/2017), but patient is not currently being followed by a cardiologist. Dr. Evlyn KannerSouth describes patient as active. He denied chest pain, cough, fever, SOB at PAT.   Per latest [facility] notation (12/19/17), he should hold Plavix for 5 days but continue ASA.   Reviewed available information with anesthesiologist Arta Brucessey, Kevin, MD. Patient with known CAD requiring CABG, PAD, carotid stenosis, mild AS, HTN without recent cardiology follow-up. Would recommend preoperative cardiology evaluation prior to vascular surgery. I have notified [facility] RN Kriste BasqueBecky who will work on cardiology referral. Patient's EKG is abnormal, but probably stable when compared to 2017 tracing.    ADDENDUM 01/31/18 11:30 AM: Patient re-evaluated by PCP Murlean CallerSouth, [place], MD and has now had cardiology evaluation Haig Prophet(Beers, Casimiro NeedleMichael, PA-C) on 01/17/18. Preoperative stress test ordered (see below). According to clearance note signed by Carmon GinsbergBeers, Michael, PA-C scanned under the Media tab, he spoke with cardiologist Pierre BaliMeadows, Telly, MD regarding stress test and patient can proceed with  surgery ad acceptable but intermediate CV risk.  He will need updated labs prior to surgery.   VS: BP (!) 161/55   Pulse (!) 54   Temp 36.8 C   Resp 18   Ht 6' (1.829 m)   Wt 89.6 kg   SpO2 98%   BMI 26.79 kg/m    PROVIDERS: Murlean CallerSouth, [place], MD is [facility]CP ([facility] Last visit 06/19/17 for Medicare Preventive visit. She writes, "He has lost nearly 80 pounds over the past few years. He is working out 5 days/week, often with a trainer, and changed his diet to exclude red meats, fast food, and sugary drinks." She ordered an echo due to progressive murmur.   He does not currently see a cardiologist. ("Card[facility]" encounters [facility] actually pertain to his PV disease, although Almahameed, Amjad, MD is an interventional cardiologist.)  LABS: Labs reviewed: Acceptable for surgery. (all labs ordered are listed, but only abnormal results are displayed)  Labs Reviewed  CBC - Abnormal; Notable for the following components:      Result Value   RBC 3.66 (*)    Hemoglobin 11.9 (*)    HCT 36.3 (*)    All other components within normal limits  COMPREHENSIVE METABOLIC PANEL - Abnormal; Notable for the following components:   Glucose, Bld 100 (*)    All other components within normal limits  URINALYSIS, ROUTINE W REFLEX MICROSCOPIC - Abnormal; Notable for the following components:   APPearance HAZY (*)    All other components within normal limits  HEMOGLOBIN A1C - Abnormal; Notable for the following components:   Hgb A1c  MFr Bld 5.8 (*)    All other components within normal limits  SURGICAL PCR SCREEN  APTT  PROTIME-INR  TYPE AND SCREEN  ABO/RH    EKG: 06/19/17 [facility]): Marked sinusBradycardia at 49 bpm. Left atrial enlargement. Negative precordial T-waves, consider anteroseptal ischemia. Negative anteroseptal T waves are present (although now slightly more pronounced) when compared to 2015-11-01 tracing from [place].   CV: Nuclear  stress test 01/23/18 [facility]): PERFUSION ABNORMAL SPECT Cardiolite perfusion study with small size, mild to moderate severity reversible perfusion defect consistent with ischemia involving LV apex. Findings are suggestive of obstructive disease involving left anterior desending artery. WALL MOTION AND EJECTION FRACTION Gated SPECT analysis revealed normal size left ventricle with normal wall motion and thickness of wall segments with an ejection fraction of 68%.  TID is normal, 1.01. STUDY QUALITY AND RISK Excellent quality and low risk study. No prior studies for comparison. Clinical correlation recommended.  Carotid U/S 12/19/17: Summary: Right Carotid: Velocities in the right ICA are consistent with a 1-39% stenosis.        Non-hemodynamically significant plaque <50% noted in the CCA. The        ECA appears <50% stenosed. Left Carotid: Velocities in the left ICA are consistent with a 80-99% stenosis.       Non-hemodynamically significant plaque noted in the CCA. The ECA       appears <50% stenosed. Vertebrals: Left vertebral artery demonstrates antegrade flow. Right vertebral       artery demonstrates retrograde flow. Subclavians: Normal flow hemodynamics were seen in bilateral subclavian       arteries.  Echo 06/22/17 [facility]): Summary: Left ventricular systolic function is normal.  EF > 55%. The left atrium is moderately dilated.  The right atrium is mildly dilated. There is trace mitral regurgitation. There is trace tricuspid regurgitation. RVSP is normal. The aortic valve is moderately sclerotic.  Mild valvular aortic stenosis.  Aortic valve peak velocity is 2.4 m/sec. The peak gradient is 24 mmHg, the mean gradient 15 mmHg.   Trace pulmonic valvular regurgitation.  Bilateral CCA and innominate angiography 11/15/16: IMPRESSION: 1. Angiographically occluded right vertebral artery at the C5 level with  distal reconstitution from the ascending cervical branch of the right thyrocervical trunk, to the opacification of the right posterior inferior cerebellar artery. Occluded right vertebrobasilar junction distal to this. 2. Occluded left vertebral artery at its origin, with the partial distal reconstitution from the ascending cervical branch of the left thyrocervical trunk at the level of C5, with distal reinforcement from the left occipital artery, and the posterior branch of the ascending pharyngeal branch of the left external carotid artery. 3. Approximately 60% stenosis of the left internal carotid artery at the bulb by the NASCET criteria, associated with two ulcerations. 4. Patency of the right posterior communicating artery opacifying primarily the right posterior cerebral artery, and to a lesser degree the left posterior cerebral artery.  Aortogram with runoff to pelvis 01/29/16 (Almahameed, Amjad, MD; [facility]).  ANGIOGRAPHIC FINDINGS: Abdominal aorta and iliac arteries are heavily calcified with mild luminal irregularities. No gradient noted upon pullback of 0.035 Rubicon catheter from proximal L SFA to the R CIA and from the supra-renal aorta to the R EIA.  L CFA, profunda, SFA and popliteal have mild luminal irregularities. The L AT occludes proximally and reconstitutes at mid. The Peroneal gos all the way to the ankle. The PT has ostial 50% and runs all the way to the foot as  its primary artery.  Plan:  1. No significant angiographic or hemodynamic disease identifies in L leg major vessels. L AT proximal occlusion. Two vessel runoff to the ankle (peroneal and PT) and one vessel runoff to the foot (PT).  2. Findings do not explain patient's symptoms and his ABI drop with toe raises.  3. Medical management for now.  Nuclear stress test 03/07/14 [facility] Everywhere): IMPRESSION: 1. Normal Cardiolite perfusion without evidence of infarct or ischemia. 2.  Nondiagnostic Lexiscan Cardiolite. 3. Normal LV function by gated SPECT without focal wall motion abnormality. Ejection fraction is 64%. 4. Study quality is excellent and overall risk is low.    Past Medical History:  Diagnosis Date  . CAD (coronary artery disease)   . Carotid artery occlusion   . Diabetes mellitus without complication (HCC)   . Diverticulitis   . Dizziness   . Gout   . Hyperlipemia   . Hypertension   . Peripheral vascular disease [facility])     Past Surgical History:  Procedure Laterality Date  . BACK SURGERY  2012  . CARDIAC SURGERY  1998   Bypass  . FEMORAL ARTERY STENT    . IR ANGIO INTRA EXTRACRAN SEL COM CAROTID INNOMINATE BILAT MOD SED  11/15/2016  . IR ANGIO VERTEBRAL SEL SUBCLAVIAN INNOMINATE BILAT MOD SED  11/15/2016    MEDICATIONS: . amLODipine (NORVASC) 10 MG tablet  . aspirin EC 81 MG tablet  . atorvastatin (LIPITOR) 80 MG tablet  . chlorthalidone (HYGROTON) 25 MG tablet  . clopidogrel (PLAVIX) 75 MG tablet  . diclofenac sodium (VOLTAREN) 1 % GEL  . ezetimibe (ZETIA) 10 MG tablet  . losartan (COZAAR) 100 MG tablet  . metFORMIN (GLUCOPHAGE) 500 MG tablet  . Multiple Vitamin (MULTI VITAMIN DAILY PO)  . Omega-3 Fatty Acids (FISH OIL) 1000 MG CAPS   No current facility-administered medications for this encounter.     Velna Ochs [facility] Short Stay Center/Anesthesiology Phone [phone] 01/03/2018 4:43 PM

## 2018-01-04 ENCOUNTER — Telehealth: Payer: Self-pay | Admitting: *Deleted

## 2018-01-04 NOTE — Telephone Encounter (Signed)
Anesthesia requesting Cardiac Clearance pre-op.[facility] [place] [facility] [place]. [facility] that they have called patient to make office appointment for clearance. Patient has recent EKG and 2D echo done by family practice doctor with [facility]  that was faxed to above office.

## 2018-01-26 ENCOUNTER — Telehealth: Payer: Self-pay | Admitting: *Deleted

## 2018-01-26 NOTE — Telephone Encounter (Signed)
Multiple phone calls made this week to [facility] in an attempt to get stress test result and clearance form for surgery. Left messages for nurse and M. Beers PA. No fax or return call.

## 2018-01-30 ENCOUNTER — Other Ambulatory Visit: Payer: Self-pay

## 2018-01-30 ENCOUNTER — Encounter (HOSPITAL_COMMUNITY): Payer: Self-pay | Admitting: *Deleted

## 2018-01-30 NOTE — Progress Notes (Signed)
Spoke with pt for pre-op call. Pt had a PAT appt on 01/02/18 but surgery was rescheduled due to pt needing cardiac clearance. Pt has now received clearance and is in Epic under the Media tab. Pt is a type 2 diabetic. Last A1C was 5.8 on 01/02/18. Pt states his fasting blood sugar is usually around mid 80's. Instructed pt not to take Metformin the morning of surgery, but to check his blood sugar when he gets up in the AM. If blood sugar is 70 or below, treat with 1/2 cup of clear juice (apple or cranberry) and recheck blood sugar 15 minutes after drinking juice.

## 2018-01-31 NOTE — Addendum Note (Signed)
Addendum  created 01/31/18 1138 by Jerold CoombeZelenak, Aileana Hodder W, PA-C   Sign clinical note

## 2018-02-01 ENCOUNTER — Encounter (HOSPITAL_COMMUNITY): Payer: Self-pay | Admitting: *Deleted

## 2018-02-01 ENCOUNTER — Inpatient Hospital Stay (HOSPITAL_COMMUNITY): Payer: Medicare Other | Admitting: Vascular Surgery

## 2018-02-01 ENCOUNTER — Inpatient Hospital Stay (HOSPITAL_COMMUNITY)
Admission: RE | Admit: 2018-02-01 | Discharge: 2018-02-02 | DRG: 039 | Disposition: A | Discharge status: Home / Self Care | Payer: Medicare Other | Source: Ambulatory Visit | Attending: Vascular Surgery | Admitting: Vascular Surgery

## 2018-02-01 ENCOUNTER — Telehealth: Payer: Self-pay | Admitting: Vascular Surgery

## 2018-02-01 ENCOUNTER — Encounter (HOSPITAL_COMMUNITY)
Admission: RE | Disposition: A | Discharge status: Home / Self Care | Payer: Self-pay | Source: Ambulatory Visit | Attending: Vascular Surgery

## 2018-02-01 DIAGNOSIS — Z7902 Long term (current) use of antithrombotics/antiplatelets: Secondary | ICD-10-CM

## 2018-02-01 DIAGNOSIS — E1151 Type 2 diabetes mellitus with diabetic peripheral angiopathy without gangrene: Secondary | ICD-10-CM | POA: Diagnosis present

## 2018-02-01 DIAGNOSIS — Z7982 Long term (current) use of aspirin: Secondary | ICD-10-CM | POA: Diagnosis not present

## 2018-02-01 DIAGNOSIS — Z79899 Other long term (current) drug therapy: Secondary | ICD-10-CM

## 2018-02-01 DIAGNOSIS — Z7984 Long term (current) use of oral hypoglycemic drugs: Secondary | ICD-10-CM

## 2018-02-01 DIAGNOSIS — E1142 Type 2 diabetes mellitus with diabetic polyneuropathy: Secondary | ICD-10-CM | POA: Diagnosis present

## 2018-02-01 DIAGNOSIS — I6522 Occlusion and stenosis of left carotid artery: Principal | ICD-10-CM | POA: Diagnosis present

## 2018-02-01 DIAGNOSIS — I2510 Atherosclerotic heart disease of native coronary artery without angina pectoris: Secondary | ICD-10-CM | POA: Diagnosis present

## 2018-02-01 DIAGNOSIS — E785 Hyperlipidemia, unspecified: Secondary | ICD-10-CM | POA: Diagnosis present

## 2018-02-01 DIAGNOSIS — Z87891 Personal history of nicotine dependence: Secondary | ICD-10-CM

## 2018-02-01 DIAGNOSIS — I10 Essential (primary) hypertension: Secondary | ICD-10-CM | POA: Diagnosis present

## 2018-02-01 DIAGNOSIS — Z888 Allergy status to other drugs, medicaments and biological substances status: Secondary | ICD-10-CM | POA: Diagnosis not present

## 2018-02-01 DIAGNOSIS — I6529 Occlusion and stenosis of unspecified carotid artery: Secondary | ICD-10-CM | POA: Diagnosis present

## 2018-02-01 HISTORY — PX: PATCH ANGIOPLASTY: SHX6230

## 2018-02-01 HISTORY — PX: ENDARTERECTOMY: SHX5162

## 2018-02-01 LAB — COMPREHENSIVE METABOLIC PANEL
ALT: 15 U/L (ref 0–44)
ANION GAP: 10 (ref 5–15)
AST: 26 U/L (ref 15–41)
Albumin: 3.7 g/dL (ref 3.5–5.0)
Alkaline Phosphatase: 40 U/L (ref 38–126)
BUN: 15 mg/dL (ref 8–23)
CO2: 26 mmol/L (ref 22–32)
Calcium: 9.3 mg/dL (ref 8.9–10.3)
Chloride: 102 mmol/L (ref 98–111)
Creatinine, Ser: 0.95 mg/dL (ref 0.61–1.24)
GFR calc Af Amer: >60 mL/min (ref >60)
GFR calc non Af Amer: >60 mL/min (ref >60)
GLUCOSE: 100 mg/dL — AB (ref 70–99)
Potassium: 3.9 mmol/L (ref 3.5–5.1)
Sodium: 138 mmol/L (ref 135–145)
TOTAL PROTEIN: 6.9 g/dL (ref 6.5–8.1)
Total Bilirubin: 0.8 mg/dL (ref 0.3–1.2)

## 2018-02-01 LAB — CBC
HCT: 31.6 % — ABNORMAL LOW (ref 39.0–52.0)
Hemoglobin: 10.5 g/dL — ABNORMAL LOW (ref 13.0–17.0)
MCH: 31.8 pg (ref 26.0–34.0)
MCHC: 33.2 g/dL (ref 30.0–36.0)
MCV: 95.8 fL (ref 80.0–100.0)
Platelets: 215 10*3/uL (ref 150–400)
RBC: 3.3 MIL/uL — ABNORMAL LOW (ref 4.22–5.81)
RDW: 11.9 % (ref 11.5–15.5)
WBC: 4.7 10*3/uL (ref 4.0–10.5)
nRBC: 0 % (ref 0.0–0.2)

## 2018-02-01 LAB — TYPE AND SCREEN
ABO/RH(D): O POS
ANTIBODY SCREEN: NEGATIVE

## 2018-02-01 LAB — GLUCOSE, CAPILLARY
GLUCOSE-CAPILLARY: 92 mg/dL (ref 70–99)
Glucose-Capillary: 98 mg/dL (ref 70–99)

## 2018-02-01 LAB — PROTIME-INR
INR: 1.01
Prothrombin Time: 13.2 seconds (ref 11.4–15.2)

## 2018-02-01 LAB — APTT: aPTT: 36 seconds (ref 24–36)

## 2018-02-01 SURGERY — ENDARTERECTOMY, CAROTID
Anesthesia: General | Site: Neck | Laterality: Left

## 2018-02-01 MED ORDER — ASPIRIN EC 81 MG PO TBEC
81.0000 mg | DELAYED_RELEASE_TABLET | Freq: Every day | ORAL | Status: DC
  Administered 2018-02-02: 81 mg via ORAL
  Filled 2018-02-01: qty 1

## 2018-02-01 MED ORDER — LIDOCAINE HCL (CARDIAC) PF 100 MG/5ML IV SOSY
PREFILLED_SYRINGE | INTRAVENOUS | Status: DC | PRN
  Administered 2018-02-01: 100 mg via INTRAVENOUS

## 2018-02-01 MED ORDER — HEPARIN SODIUM (PORCINE) 1000 UNIT/ML IJ SOLN
INTRAMUSCULAR | Status: DC | PRN
  Administered 2018-02-01: 9000 [IU] via INTRAVENOUS

## 2018-02-01 MED ORDER — SODIUM CHLORIDE 0.9 % IV SOLN
INTRAVENOUS | Status: DC | PRN
  Administered 2018-02-01: 40 ug/min via INTRAVENOUS

## 2018-02-01 MED ORDER — CHLORTHALIDONE 25 MG PO TABS
12.5000 mg | ORAL_TABLET | ORAL | Status: DC
  Administered 2018-02-02: 12.5 mg via ORAL
  Filled 2018-02-01: qty 0.5

## 2018-02-01 MED ORDER — SODIUM CHLORIDE 0.9 % IV SOLN: INTRAVENOUS | Status: DC

## 2018-02-01 MED ORDER — FENTANYL CITRATE (PF) 250 MCG/5ML IJ SOLN
INTRAMUSCULAR | Status: AC
  Filled 2018-02-01: qty 5

## 2018-02-01 MED ORDER — PROPOFOL 10 MG/ML IV BOLUS
INTRAVENOUS | Status: DC | PRN
  Administered 2018-02-01: 150 mg via INTRAVENOUS
  Administered 2018-02-01: 50 mg via INTRAVENOUS

## 2018-02-01 MED ORDER — DOCUSATE SODIUM 100 MG PO CAPS
100.0000 mg | ORAL_CAPSULE | Freq: Every day | ORAL | Status: DC
  Administered 2018-02-02: 100 mg via ORAL
  Filled 2018-02-01: qty 1

## 2018-02-01 MED ORDER — ALBUMIN HUMAN 5 % IV SOLN
INTRAVENOUS | Status: AC
  Filled 2018-02-01: qty 250

## 2018-02-01 MED ORDER — ACETAMINOPHEN 325 MG RE SUPP: 325.0000 mg | RECTAL | Status: DC | PRN

## 2018-02-01 MED ORDER — PROTAMINE SULFATE 10 MG/ML IV SOLN
INTRAVENOUS | Status: DC | PRN
  Administered 2018-02-01: 40 mg via INTRAVENOUS
  Administered 2018-02-01: 10 mg via INTRAVENOUS

## 2018-02-01 MED ORDER — LIDOCAINE 2% (20 MG/ML) 5 ML SYRINGE
INTRAMUSCULAR | Status: AC
  Filled 2018-02-01: qty 5

## 2018-02-01 MED ORDER — HYDROMORPHONE HCL 1 MG/ML IJ SOLN: 0.5000 mg | INTRAMUSCULAR | Status: DC | PRN

## 2018-02-01 MED ORDER — PROPOFOL 10 MG/ML IV BOLUS
INTRAVENOUS | Status: AC
  Filled 2018-02-01: qty 20

## 2018-02-01 MED ORDER — SUGAMMADEX SODIUM 200 MG/2ML IV SOLN
INTRAVENOUS | Status: DC | PRN
  Administered 2018-02-01: 200 mg via INTRAVENOUS

## 2018-02-01 MED ORDER — ROCURONIUM BROMIDE 10 MG/ML (PF) SYRINGE
PREFILLED_SYRINGE | INTRAVENOUS | Status: DC | PRN
  Administered 2018-02-01: 10 mg via INTRAVENOUS
  Administered 2018-02-01: 50 mg via INTRAVENOUS
  Administered 2018-02-01: 20 mg via INTRAVENOUS

## 2018-02-01 MED ORDER — FENTANYL CITRATE (PF) 100 MCG/2ML IJ SOLN
INTRAMUSCULAR | Status: DC | PRN
  Administered 2018-02-01 (×2): 100 ug via INTRAVENOUS
  Administered 2018-02-01: 50 ug via INTRAVENOUS

## 2018-02-01 MED ORDER — AMLODIPINE BESYLATE 10 MG PO TABS
10.0000 mg | ORAL_TABLET | Freq: Every day | ORAL | Status: DC
  Administered 2018-02-02: 10 mg via ORAL
  Filled 2018-02-01: qty 1

## 2018-02-01 MED ORDER — CLOPIDOGREL BISULFATE 75 MG PO TABS
75.0000 mg | ORAL_TABLET | Freq: Every day | ORAL | Status: DC
  Administered 2018-02-01 – 2018-02-02 (×2): 75 mg via ORAL
  Filled 2018-02-01 (×2): qty 1

## 2018-02-01 MED ORDER — CEFAZOLIN SODIUM-DEXTROSE 2-4 GM/100ML-% IV SOLN
2.0000 g | INTRAVENOUS | Status: AC
  Administered 2018-02-01: 2 g via INTRAVENOUS
  Filled 2018-02-01: qty 100

## 2018-02-01 MED ORDER — ENOXAPARIN SODIUM 40 MG/0.4ML ~~LOC~~ SOLN: 40.0000 mg | SUBCUTANEOUS | Status: DC

## 2018-02-01 MED ORDER — LACTATED RINGERS IV SOLN
INTRAVENOUS | Status: DC | PRN
  Administered 2018-02-01 (×2): via INTRAVENOUS

## 2018-02-01 MED ORDER — LOSARTAN POTASSIUM 50 MG PO TABS
100.0000 mg | ORAL_TABLET | Freq: Every day | ORAL | Status: DC
  Administered 2018-02-02: 100 mg via ORAL
  Filled 2018-02-01: qty 2

## 2018-02-01 MED ORDER — HYDRALAZINE HCL 20 MG/ML IJ SOLN: 5.0000 mg | INTRAMUSCULAR | Status: DC | PRN

## 2018-02-01 MED ORDER — GUAIFENESIN-DM 100-10 MG/5ML PO SYRP: 15.0000 mL | ORAL_SOLUTION | ORAL | Status: DC | PRN

## 2018-02-01 MED ORDER — CEFAZOLIN SODIUM-DEXTROSE 2-4 GM/100ML-% IV SOLN
2.0000 g | Freq: Three times a day (TID) | INTRAVENOUS | Status: AC
  Administered 2018-02-01 (×2): 2 g via INTRAVENOUS
  Filled 2018-02-01: qty 100

## 2018-02-01 MED ORDER — ONDANSETRON HCL 4 MG/2ML IJ SOLN
INTRAMUSCULAR | Status: DC | PRN
  Administered 2018-02-01: 4 mg via INTRAVENOUS

## 2018-02-01 MED ORDER — SENNOSIDES-DOCUSATE SODIUM 8.6-50 MG PO TABS: 1.0000 | ORAL_TABLET | Freq: Every evening | ORAL | Status: DC | PRN

## 2018-02-01 MED ORDER — POTASSIUM CHLORIDE CRYS ER 20 MEQ PO TBCR: 20.0000 meq | EXTENDED_RELEASE_TABLET | Freq: Every day | ORAL | Status: DC | PRN

## 2018-02-01 MED ORDER — ATORVASTATIN CALCIUM 80 MG PO TABS: 80.0000 mg | ORAL_TABLET | Freq: Every evening | ORAL | Status: DC

## 2018-02-01 MED ORDER — SODIUM CHLORIDE 0.9 % IV SOLN: 500.0000 mL | Freq: Once | INTRAVENOUS | Status: DC | PRN

## 2018-02-01 MED ORDER — CHLORHEXIDINE GLUCONATE 4 % EX LIQD: 60.0000 mL | Freq: Once | CUTANEOUS | Status: DC

## 2018-02-01 MED ORDER — FLEET ENEMA 7-19 GM/118ML RE ENEM: 1.0000 | ENEMA | Freq: Once | RECTAL | Status: DC | PRN

## 2018-02-01 MED ORDER — MAGNESIUM SULFATE 2 GM/50ML IV SOLN: 2.0000 g | Freq: Every day | INTRAVENOUS | Status: DC | PRN

## 2018-02-01 MED ORDER — SODIUM CHLORIDE 0.9 % IV SOLN
INTRAVENOUS | Status: DC | PRN
  Administered 2018-02-01: 500 mL

## 2018-02-01 MED ORDER — GLYCOPYRROLATE PF 0.2 MG/ML IJ SOSY
PREFILLED_SYRINGE | INTRAMUSCULAR | Status: AC
  Filled 2018-02-01: qty 1

## 2018-02-01 MED ORDER — ALBUMIN HUMAN 5 % IV SOLN
12.5000 g | Freq: Once | INTRAVENOUS | Status: AC
  Administered 2018-02-01: 12.5 g via INTRAVENOUS

## 2018-02-01 MED ORDER — ROCURONIUM BROMIDE 50 MG/5ML IV SOSY
PREFILLED_SYRINGE | INTRAVENOUS | Status: AC
  Filled 2018-02-01: qty 5

## 2018-02-01 MED ORDER — FENTANYL CITRATE (PF) 100 MCG/2ML IJ SOLN: 25.0000 ug | INTRAMUSCULAR | Status: DC | PRN

## 2018-02-01 MED ORDER — GLYCOPYRROLATE PF 0.2 MG/ML IJ SOSY
PREFILLED_SYRINGE | INTRAMUSCULAR | Status: DC | PRN
  Administered 2018-02-01 (×2): .1 mg via INTRAVENOUS

## 2018-02-01 MED ORDER — CEFAZOLIN SODIUM-DEXTROSE 2-4 GM/100ML-% IV SOLN
INTRAVENOUS | Status: AC
  Filled 2018-02-01: qty 100

## 2018-02-01 MED ORDER — METOPROLOL TARTRATE 5 MG/5ML IV SOLN: 2.0000 mg | INTRAVENOUS | Status: DC | PRN

## 2018-02-01 MED ORDER — EZETIMIBE 10 MG PO TABS: 10.0000 mg | ORAL_TABLET | Freq: Every day | ORAL | Status: DC

## 2018-02-01 MED ORDER — LIDOCAINE HCL (PF) 1 % IJ SOLN
INTRAMUSCULAR | Status: AC
  Filled 2018-02-01: qty 30

## 2018-02-01 MED ORDER — EPHEDRINE SULFATE-NACL 50-0.9 MG/10ML-% IV SOSY
PREFILLED_SYRINGE | INTRAVENOUS | Status: DC | PRN
  Administered 2018-02-01 (×4): 5 mg via INTRAVENOUS

## 2018-02-01 MED ORDER — 0.9 % SODIUM CHLORIDE (POUR BTL) OPTIME
TOPICAL | Status: DC | PRN
  Administered 2018-02-01: 2000 mL

## 2018-02-01 MED ORDER — SODIUM CHLORIDE 0.9 % IV SOLN
INTRAVENOUS | Status: AC
  Filled 2018-02-01: qty 1.2

## 2018-02-01 MED ORDER — OXYCODONE HCL 5 MG PO TABS: 5.0000 mg | ORAL_TABLET | ORAL | Status: DC | PRN

## 2018-02-01 MED ORDER — PHENOL 1.4 % MT LIQD: 1.0000 | OROMUCOSAL | Status: DC | PRN

## 2018-02-01 MED ORDER — METFORMIN HCL 500 MG PO TABS
500.0000 mg | ORAL_TABLET | Freq: Two times a day (BID) | ORAL | Status: DC
  Administered 2018-02-02: 500 mg via ORAL
  Filled 2018-02-01: qty 1

## 2018-02-01 MED ORDER — ALUM & MAG HYDROXIDE-SIMETH 200-200-20 MG/5ML PO SUSP: 15.0000 mL | ORAL | Status: DC | PRN

## 2018-02-01 MED ORDER — ONDANSETRON HCL 4 MG/2ML IJ SOLN
INTRAMUSCULAR | Status: AC
  Filled 2018-02-01: qty 2

## 2018-02-01 MED ORDER — ACETAMINOPHEN 325 MG PO TABS
325.0000 mg | ORAL_TABLET | ORAL | Status: DC | PRN
  Administered 2018-02-01: 650 mg via ORAL
  Filled 2018-02-01: qty 2

## 2018-02-01 MED ORDER — LABETALOL HCL 5 MG/ML IV SOLN: 10.0000 mg | INTRAVENOUS | Status: DC | PRN

## 2018-02-01 MED ORDER — PANTOPRAZOLE SODIUM 40 MG PO TBEC
40.0000 mg | DELAYED_RELEASE_TABLET | Freq: Every day | ORAL | Status: DC
  Administered 2018-02-02: 40 mg via ORAL
  Filled 2018-02-01: qty 1

## 2018-02-01 MED ORDER — DEXAMETHASONE SODIUM PHOSPHATE 10 MG/ML IJ SOLN
INTRAMUSCULAR | Status: DC | PRN
  Administered 2018-02-01: 4 mg via INTRAVENOUS

## 2018-02-01 MED ORDER — ONDANSETRON HCL 4 MG/2ML IJ SOLN: 4.0000 mg | Freq: Four times a day (QID) | INTRAMUSCULAR | Status: DC | PRN

## 2018-02-01 MED ORDER — HEPARIN SODIUM (PORCINE) 1000 UNIT/ML IJ SOLN
INTRAMUSCULAR | Status: AC
  Filled 2018-02-01: qty 1

## 2018-02-01 MED ORDER — BISACODYL 5 MG PO TBEC: 5.0000 mg | DELAYED_RELEASE_TABLET | Freq: Every day | ORAL | Status: DC | PRN

## 2018-02-01 SURGICAL SUPPLY — 46 items
CANISTER SUCT 3000ML PPV (MISCELLANEOUS) ×3 IMPLANT
CANNULA VESSEL 3MM 2 BLNT TIP (CANNULA) ×6 IMPLANT
CATH ROBINSON RED A/P 18FR (CATHETERS) ×3 IMPLANT
CLIP LIGATING EXTRA MED SLVR (CLIP) ×3 IMPLANT
CLIP LIGATING EXTRA SM BLUE (MISCELLANEOUS) ×3 IMPLANT
COVER WAND RF STERILE (DRAPES) ×3 IMPLANT
CRADLE DONUT ADULT HEAD (MISCELLANEOUS) ×3 IMPLANT
DECANTER SPIKE VIAL GLASS SM (MISCELLANEOUS) IMPLANT
DERMABOND ADVANCED (GAUZE/BANDAGES/DRESSINGS) ×1
DERMABOND ADVANCED .7 DNX12 (GAUZE/BANDAGES/DRESSINGS) ×2 IMPLANT
DRAIN HEMOVAC 1/8 X 5 (WOUND CARE) IMPLANT
ELECT REM PT RETURN 9FT ADLT (ELECTROSURGICAL) ×3
ELECTRODE REM PT RTRN 9FT ADLT (ELECTROSURGICAL) ×2 IMPLANT
EVACUATOR SILICONE 100CC (DRAIN) IMPLANT
GLOVE BIO SURGEON STRL SZ 6.5 (GLOVE) ×9 IMPLANT
GLOVE BIOGEL PI IND STRL 6.5 (GLOVE) ×4 IMPLANT
GLOVE BIOGEL PI IND STRL 7.0 (GLOVE) ×2 IMPLANT
GLOVE BIOGEL PI IND STRL 7.5 (GLOVE) ×2 IMPLANT
GLOVE BIOGEL PI INDICATOR 6.5 (GLOVE) ×2
GLOVE BIOGEL PI INDICATOR 7.0 (GLOVE) ×1
GLOVE BIOGEL PI INDICATOR 7.5 (GLOVE) ×1
GLOVE ECLIPSE 7.0 STRL STRAW (GLOVE) ×3 IMPLANT
GLOVE SS BIOGEL STRL SZ 7.5 (GLOVE) ×2 IMPLANT
GLOVE SUPERSENSE BIOGEL SZ 7.5 (GLOVE) ×1
GOWN STRL REUS W/ TWL LRG LVL3 (GOWN DISPOSABLE) ×10 IMPLANT
GOWN STRL REUS W/TWL LRG LVL3 (GOWN DISPOSABLE) ×5
KIT BASIN OR (CUSTOM PROCEDURE TRAY) ×3 IMPLANT
KIT SHUNT ARGYLE CAROTID ART 6 (VASCULAR PRODUCTS) IMPLANT
KIT TURNOVER KIT B (KITS) ×3 IMPLANT
NEEDLE 22X1 1/2 (OR ONLY) (NEEDLE) IMPLANT
NS IRRIG 1000ML POUR BTL (IV SOLUTION) ×6 IMPLANT
PACK CAROTID (CUSTOM PROCEDURE TRAY) ×3 IMPLANT
PAD ARMBOARD 7.5X6 YLW CONV (MISCELLANEOUS) ×6 IMPLANT
PATCH HEMASHIELD 8X75 (Vascular Products) ×3 IMPLANT
SHUNT CAROTID BYPASS 10 (VASCULAR PRODUCTS) IMPLANT
SHUNT CAROTID BYPASS 12FRX15.5 (VASCULAR PRODUCTS) ×3 IMPLANT
SUT ETHILON 3 0 PS 1 (SUTURE) IMPLANT
SUT PROLENE 6 0 CC (SUTURE) ×9 IMPLANT
SUT SILK 3 0 (SUTURE)
SUT SILK 3-0 18XBRD TIE 12 (SUTURE) IMPLANT
SUT VIC AB 3-0 SH 27 (SUTURE) ×2
SUT VIC AB 3-0 SH 27X BRD (SUTURE) ×4 IMPLANT
SUT VICRYL 4-0 PS2 18IN ABS (SUTURE) ×3 IMPLANT
SYR CONTROL 10ML LL (SYRINGE) IMPLANT
TOWEL GREEN STERILE (TOWEL DISPOSABLE) ×3 IMPLANT
WATER STERILE IRR 1000ML POUR (IV SOLUTION) ×3 IMPLANT

## 2018-02-01 NOTE — Progress Notes (Signed)
Tx from PACU. Pt given chg bath and tele box 15 applied. Vitals stable. Pt de3nis any complaints. A-line zeroed. Good wave form. Will continue to monitor.

## 2018-02-01 NOTE — Discharge Instructions (Signed)
   [facility]  Discharge Instructions   Carotid Endarterectomy (CEA)  Please refer to the following instructions for your post-procedure care. Your surgeon or physician assistant will discuss any changes with you.  Activity  You are encouraged to walk as much as you can. You can slowly return to normal activities but must avoid strenuous activity and heavy lifting until your doctor tell you it's OK. Avoid activities such as vacuuming or swinging a golf club. You can drive after one week if you are comfortable and you are no longer taking prescription pain medications. It is normal to feel tired for serval weeks after your surgery. It is also normal to have difficulty with sleep habits, eating, and bowel movements after surgery. These will go away with time.  Bathing/Showering  You may shower after you come home. Do not soak in a bathtub, hot tub, or swim until the incision heals completely.  Incision Care  Shower every day. Clean your incision with mild soap and water. Pat the area dry with a clean towel. You do not need a bandage unless otherwise instructed. Do not apply any ointments or creams to your incision. You may have skin glue on your incision. Do not peel it off. It will come off on its own in about one week. Your incision may feel thickened and raised for several weeks after your surgery. This is normal and the skin will soften over time. For Men Only: It's OK to shave around the incision but do not shave the incision itself for 2 weeks. It is common to have numbness under your chin that could last for several months.  Diet  Resume your normal diet. There are no special food restrictions following this procedure. A low fat/low cholesterol diet is recommended for all patients with vascular disease. In order to heal from your surgery, it is CRITICAL to get adequate nutrition. Your body requires vitamins, minerals, and protein. Vegetables are the best  source of vitamins and minerals. Vegetables also provide the perfect balance of protein. Processed food has little nutritional value, so try to avoid this.        Medications  Resume taking all of your medications unless your doctor or physician assistant tells you not to. If your incision is causing pain, you may take over-the- counter pain relievers such as acetaminophen (Tylenol). If you were prescribed a stronger pain medication, please be aware these medications can cause nausea and constipation. Prevent nausea by taking the medication with a snack or meal. Avoid constipation by drinking plenty of fluids and eating foods with a high amount of fiber, such as fruits, vegetables, and grains. Do not take Tylenol if you are taking prescription pain medications.  Follow Up  Our office will schedule a follow up appointment 2-3 weeks following discharge.  Please call us immediately for any of the following conditions  Increased pain, redness, drainage (pus) from your incision site. Fever of 101 degrees or higher. If you should develop stroke (slurred speech, difficulty swallowing, weakness on one side of your body, loss of vision) you should call 911 and go to the nearest emergency room.  Reduce your risk of vascular disease:  Stop smoking. If you would like help call [facility] at 1-800-QUIT-NOW (1-800-784-8669) or [facility] at 336-586-4000. Manage your cholesterol Maintain a desired weight Control your diabetes Keep your blood pressure down  If you have any questions, please call the office at [phone].   

## 2018-02-01 NOTE — Anesthesia Postprocedure Evaluation (Signed)
Anesthesia Post Note  Patient: Glenn Rollins  Procedure(s) Performed: Left Carotid Endartarectomy (Left ) PATCH ANGIOPLASTY (Left Neck)     Patient location during evaluation: PACU Anesthesia Type: General Level of consciousness: awake Pain management: pain level controlled Vital Signs Assessment: post-procedure vital signs reviewed and stable Respiratory status: spontaneous breathing Cardiovascular status: stable Postop Assessment: no headache Anesthetic complications: no    Last Vitals:  Vitals:   02/01/18 1045 02/01/18 1050  BP:  (!) 101/47  Pulse: (!) 58 (!) 57  Resp: (!) 8 (!) 5  Temp:    SpO2: 100% 100%    Last Pain:  Vitals:   02/01/18 1051  TempSrc:   PainSc: Asleep                 Arrianna Catala

## 2018-02-01 NOTE — Anesthesia Procedure Notes (Signed)
Arterial Line Insertion Start/End12/01/2018 7:05 AM, 02/01/2018 7:16 AM  Left, radial was placed Catheter size: 20 G Hand hygiene performed  and maximum sterile barriers used   Attempts: 1 Procedure performed without using ultrasound guided technique. Following insertion, dressing applied and Biopatch. Post procedure assessment: normal and unchanged  Patient tolerated the procedure well with no immediate complications. Additional procedure comments: A- Line placed by Sena HitchJeremy Blount, SRNA.

## 2018-02-01 NOTE — Op Note (Signed)
    OPERATIVE REPORT  DATE OF SURGERY: 02/01/2018  PATIENT: Glenn Rollins, 67 y.o. male MRN: [id number]  DOB: 26-Apr-1950  PRE-OPERATIVE DIAGNOSIS: Asymptomatic left carotid stenosis  POST-OPERATIVE DIAGNOSIS:  Same  PROCEDURE: Left carotid endarterectomy and Dacron patch angioplasty  SURGEON:  Gretta Beganodd Anitra Doxtater, M.D.  PHYSICIAN ASSISTANT: Clinton GallantEmma Collins, PA-C  ANESTHESIA: General  EBL: per anesthesia record  Total I/O In: 1000 [I.V.:1000] Out: 100 [Blood:100]  BLOOD ADMINISTERED: none  DRAINS: none  SPECIMEN: none  COUNTS CORRECT:  YES  PATIENT DISPOSITION:  PACU - hemodynamically stable  PROCEDURE DETAILS: Patient was taken to the operating placed supine position where the area the left neck strip draped you sterile fashion.  Incision was made anterior sternocleidomastoid and carried down through through the platysma electrocautery.  The sternocleidomastoid reflected posteriorly and the carotid sheath was opened.  The common carotid artery was encircled with an umbilical tape and Rummel tourniquet.  Dissection was extended onto the bifurcation.  The superior thyroid artery was encircled with a 2-0 silk Potts tie.  The external carotid was encircled with a blue vessel loop and the internal carotid was encircled with an umbilical tape and Rummel tourniquet.  The vagus and hypoglossal nerves were identified and preserved.  The patient was given 9000 units of intravenous heparin and after adequate circulation time the internal/external and common carotid arteries were occluded.  The common carotid artery was opened with 11 blade and sent longitudinally with Potts scissors.  A 10 shunt was passed up the internal carotid and allowed to backbleed then down the common carotid where secured with Rummel tourniquet's.  The endarterectomy was again on the common carotid artery and the plaque was divided proximally with Potts scissors.  The endarterectomy was cut over the bifurcation.  The  external carotid was endarterectomized with an eversion technique and the internal carotid was endarterectomized in an open fashion.  Remaining atheromatous debris was removed from the endarterectomy plane.  A Finesse Hemashield Dacron patch was brought into the field and was sewn as a patch angioplasty with a running 6-0 Prolene suture.  Prior to completion of the closure the shunt was removed and the usual flushing maneuvers were undertaken.  Anastomosis was completed and flow was restored first to the external and the internal carotid artery.  Excellent flow characteristics were noted with hand-held Doppler in the internal and external carotid arteries.  The patient was given 50 mg of protamine to reverse the heparin.  The wounds irrigated with saline.  Hemostasis electrocautery.  The wounds were closed with 3-0 Vicryl to reapproximate sternocleidomastoid over the carotid sheath.  Next the platysma was run with a 3-0 Vicryl suture and the skin was then closed with a 4-0 subcuticular Vicryl stitch.  Sterile dressing was applied.  The patient was awakened neurologically intact in the operating room and transferred to the recovery room in stable condition   Larina Earthlyodd F. Hailei Besser, M.D., [facility] 02/01/2018 10:20 AM

## 2018-02-01 NOTE — Telephone Encounter (Signed)
-----   Message from Sharee PimpleMarilyn K McChesney, RN sent at 02/01/2018 10:05 AM EST ----- Regarding: 2-3 weeks postop CEA  ----- Message ----- From: Lars Mageollins, Emma M, PA-C Sent: 02/01/2018   9:55 AM EST To: [facility] Admin Pool, [facility] Charge Pool   S/P left CEA by Dr. Arbie CookeyEarly f/u in Dr. Arbie CookeyEarly in 2-3 weeks

## 2018-02-01 NOTE — Transfer of Care (Signed)
2Immediate Anesthesia Transfer of Care Note  Patient: Glenn Rollins  Procedure(s) Performed: Left Carotid Endartarectomy (Left ) PATCH ANGIOPLASTY (Left Neck)  Patient Location: PACU  Anesthesia Type:General  Level of Consciousness: awake, alert , oriented and patient cooperative  Airway & Oxygen Therapy: Patient Spontanous Breathing and Patient connected to nasal cannula oxygen  Post-op Assessment: Report given to RN, Post -op Vital signs reviewed and stable, Patient moving all extremities, Patient moving all extremities X 4, Patient able to stick tongue midline and Pt BP is low, Pt totally asymptomatic, Dr Chilton SiGreen notified and at bedside in PACU to assess pt, orders received for albumin 5% IV X 1.   Post vital signs: Reviewed and stable  Last Vitals:  Vitals Value Taken Time  BP 116/52 02/01/2018 10:21 AM  Temp 36.4 C 02/01/2018 10:21 AM  Pulse 64 02/01/2018 10:30 AM  Resp 11 02/01/2018 10:30 AM  SpO2 100 % 02/01/2018 10:30 AM    Last Pain:  Vitals:   02/01/18 1021  TempSrc:   PainSc: 0-No pain         Complications: No apparent anesthesia complications

## 2018-02-01 NOTE — Anesthesia Procedure Notes (Signed)
Procedure Name: Intubation Date/Time: 02/01/2018 8:15 AM Performed by: Carney Living, CRNA Pre-anesthesia Checklist: Patient identified, Emergency Drugs available, Suction available, Patient being monitored and Timeout performed Oxygen Delivery Method: Circle system utilized Preoxygenation: Pre-oxygenation with 100% oxygen Induction Type: IV induction Ventilation: Mask ventilation without difficulty Laryngoscope Size: Mac and 4 Grade View: Grade II Tube type: Oral Tube size: 7.5 mm Number of attempts: 2 Airway Equipment and Method: Patient positioned with wedge pillow,  Stylet and LTA kit utilized Placement Confirmation: ETT inserted through vocal cords under direct vision,  positive ETCO2 and breath sounds checked- equal and bilateral Secured at: 23 cm Tube secured with: Tape Dental Injury: Teeth and Oropharynx as per pre-operative assessment  Comments: Smooth IV induction after pre-oxygenation. Initial DL by Elisabeth Most SRNA using Miller 2 with grade 3 view. Second DL by Rogelia Boga CRNA with MAC 4, grade 2 view. ETT easily passed, + EtCO2, bilateral breath sounds. Atraumatic intubation. - J Blount SRNA

## 2018-02-01 NOTE — Telephone Encounter (Signed)
sch appt lvm mld lr 02/20/18 345pm p/o MD

## 2018-02-01 NOTE — H&P (Signed)
Office Visit   12/19/2017 [facility] -Peter Minium, MD  Vascular Surgery   Bilateral carotid artery stenosis +1 more  Dx   Follow-up   ; Referred by Murlean Caller, MD  Reason for Visit   Additional Documentation   Vitals:   BP 146/62 (BP Location: Left Arm, Patient Position: Sitting, Cuff Size: Normal)   Pulse 52    Resp 16   Ht 6' (1.829 m)   Wt 89.1 kg   SpO2 100%   BMI 26.64 kg/m   BSA 2.13 m     More Vitals   Flowsheets:   Clinical Intake,   Vital Signs,   MEWS Score,   Anthropometrics     Encounter Info:   Billing Info,   History,   Allergies,   Detailed Report     All Notes   Progress Notes by Larina Earthly, MD at 12/19/2017 11:45 AM  Author: Larina Earthly, MD Author Type: Physician Filed: 12/19/2017 12:50 PM  Note Status: Signed Cosign: Cosign Not Required Encounter Date: 12/19/2017  Editor: Larina Earthly, MD (Physician)                                        [facility]  Patient name: Glenn Rollins          MRN: [id number]        DOB: December 24, 1950          Sex: male  REASON FOR CONSULT: 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 male, who is here today for follow-up.  He is here with his wife since they were recently married.  He continues to have chronic dizziness but reports that he is become accustomed to this and learns to arise slowly.  He has had no focal neurologic deficits.      Past Medical History:  Diagnosis Date  . CAD (coronary artery disease)   . Carotid artery occlusion   . Diabetes mellitus without complication (HCC)   . Diverticulitis   . Dizziness   . Gout   . Hyperlipemia   . Hypertension   . Peripheral vascular disease (HCC)          Family History  Problem Relation Age of Onset  . Lung disease Mother   . Heart attack Father     SOCIAL HISTORY: Social History          Socioeconomic History  . Marital status: Unknown    Spouse name: Not on file  . Number of children: 3  . Years of education: 12th grade  . Highest education level: Not on file  Occupational History  . Occupation: Retired  Engineer, production  . Financial resource strain: Not on file  . Food insecurity:    Worry: Not on file    Inability: Not on file  . Transportation needs:    Medical: Not on file    Non-medical: Not on file  Tobacco Use  . Smoking status: Former Games developer  . Smokeless tobacco: Never Used  . Tobacco comment: 1998  Substance and Sexual Activity  . Alcohol use: No    Comment: 1998  . Drug use: No  . Sexual activity: Not on file  Lifestyle  . Physical activity:    Days per week: Not on file  Minutes per session: Not on file  . Stress: Not on file  Relationships  . Social connections:    Talks on phone: Not on file    Gets together: Not on file    Attends religious service: Not on file    Active member of club or organization: Not on file    Attends meetings of clubs or organizations: Not on file    Relationship status: Not on file  . Intimate partner violence:    Fear of current or ex partner: Not on file    Emotionally abused: Not on file    Physically abused: Not on file    Forced sexual activity: Not on file  Other Topics Concern  . Not on file  Social History Narrative   Lives at home with daughter.   Right-handed.   1 cup caffeine daily.         Allergies  Allergen Reactions  . Ace Inhibitors   . Prednisone     BP went up          Current Outpatient Medications  Medication Sig Dispense Refill  . amLODipine (NORVASC) 10 MG tablet Take 10 mg by mouth daily.     Marland Kitchen aspirin EC 81 MG tablet Take 81 mg by mouth daily.    Marland Kitchen atorvastatin (LIPITOR) 80 MG tablet Take 80 mg by mouth every evening.     . clopidogrel (PLAVIX) 75 MG tablet Take 75 mg by mouth daily.     Marland Kitchen losartan (COZAAR)  100 MG tablet Take 100 mg by mouth daily.     . Multiple Vitamin (MULTI VITAMIN DAILY PO) Take 1 tablet by mouth daily.     . Omega-3 Fatty Acids (FISH OIL) 1000 MG CAPS Take 1 capsule by mouth 2 (two) times daily.     . chlorthalidone (HYGROTON) 25 MG tablet Take 12.5 mg by mouth every morning.     . ezetimibe (ZETIA) 10 MG tablet Take 10 mg by mouth daily with lunch.     . metFORMIN (GLUCOPHAGE) 500 MG tablet Take 500 mg by mouth 2 (two) times daily with a meal.      No current facility-administered medications for this visit.     REVIEW OF SYSTEMS:  [X]  denotes positive finding, [ ]  denotes negative finding Cardiac  Comments:  Chest pain or chest pressure:    Shortness of breath upon exertion:    Short of breath when lying flat:    Irregular heart rhythm:        Vascular    Pain in calf, thigh, or hip brought on by ambulation:    Pain in feet at night that wakes you up from your sleep:     Blood clot in your veins:    Leg swelling:         Pulmonary    Oxygen at home:    Productive cough:     Wheezing:         Neurologic    Sudden weakness in arms or legs:     Sudden numbness in arms or legs:     Sudden onset of difficulty speaking or slurred speech:    Temporary loss of vision in one eye:     Problems with dizziness:  x       Gastrointestinal    Blood in stool:     Vomited blood:         Genitourinary    Burning when urinating:     Blood in  urine:         PHYSICAL EXAM:     Vitals:   12/19/17 1159 12/19/17 1200  BP: (!) 150/67 (!) 146/62  Pulse: (!) 52   Resp: 16   SpO2: 100%   Weight: 196 lb 6.4 oz (89.1 kg)   Height: 6' (1.829 m)     GENERAL: The patient is a well-nourished male, in no acute distress. The vital signs are documented above. CARDIOVASCULAR: Does have a left carotid bruit and no bruit on the right PULMONARY: There is good air exchange  ABDOMEN:  Soft and non-tender  MUSCULOSKELETAL: There are no major deformities or cyanosis. NEUROLOGIC: No focal weakness or paresthesias are detected. SKIN: There are no ulcers or rashes noted. PSYCHIATRIC: The patient has a normal affect.  DATA:  Carotid duplex today reveals progression to severe greater than 80% stenosis in his left internal carotid artery.  Moderate right carotid stenosis.  MEDICAL ISSUES: I discussed the progression of his left carotid this with the patient and his wife.  He is right-handed.  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  I have recommended endarterectomy for reduction of stroke risk.  I explained the procedure in detail to include a 1 to 2% risk of stroke with surgery and also the rare event of cranial nerve injury bleeding and infection.  I explained that typically would not expect to have any improvement in dizziness with surgery on the anterior circulation but with his known bilateral vertebral artery occlusion, he may have some improvement to generalized cerebral blood flow.  He understands and wishes to proceed at his earliest convenience   Larina Earthlyodd F. Caprina Wussow, MD [facility] Office Tel [phone] Pager [phone]       Communications      [facility] Provider CC Chart Rep sent to Murlean CallerBethany South, MD  Media   Electronic signature on 12/19/2017 10:58 AM - Signed   Communication Routing History   Recipient Method Sent by Date Sent  Murlean CallerBethany South, MD Fax Larina EarthlyEarly, Shilo Pauwels F, MD 12/19/2017  Fax: [phone] Phone: [phone]   Orders Placed    None  Medication Changes     None    Medication List   Visit Diagnoses      Addendum:  The patient has been re-examined and re-evaluated.  The patient's history and physical has been reviewed and is unchanged.    Glenn Rollins is a 67 y.o. male is being admitted with LEFT CAROTID ARTERY STENOSIS. All the risks, benefits and other  treatment options have been discussed with the patient. The patient has consented to proceed with Procedure(s): ENDARTERECTOMY CAROTID LEFT as a surgical intervention.  Breckon Reeves 02/01/2018 6:53 AM [facility]

## 2018-02-02 ENCOUNTER — Encounter (HOSPITAL_COMMUNITY): Payer: Self-pay | Admitting: Vascular Surgery

## 2018-02-02 ENCOUNTER — Other Ambulatory Visit: Payer: Self-pay

## 2018-02-02 LAB — CBC
HCT: 28.4 % — ABNORMAL LOW (ref 39.0–52.0)
Hemoglobin: 9.5 g/dL — ABNORMAL LOW (ref 13.0–17.0)
MCH: 31.9 pg (ref 26.0–34.0)
MCHC: 33.5 g/dL (ref 30.0–36.0)
MCV: 95.3 fL (ref 80.0–100.0)
Platelets: 189 10*3/uL (ref 150–400)
RBC: 2.98 MIL/uL — ABNORMAL LOW (ref 4.22–5.81)
RDW: 11.7 % (ref 11.5–15.5)
WBC: 9.5 10*3/uL (ref 4.0–10.5)
nRBC: 0 % (ref 0.0–0.2)

## 2018-02-02 LAB — BASIC METABOLIC PANEL
Anion gap: 11 (ref 5–15)
BUN: 20 mg/dL (ref 8–23)
CO2: 23 mmol/L (ref 22–32)
Calcium: 9 mg/dL (ref 8.9–10.3)
Chloride: 102 mmol/L (ref 98–111)
Creatinine, Ser: 1.13 mg/dL (ref 0.61–1.24)
GFR calc Af Amer: >60 mL/min (ref >60)
GLUCOSE: 157 mg/dL — AB (ref 70–99)
POTASSIUM: 4.3 mmol/L (ref 3.5–5.1)
Sodium: 136 mmol/L (ref 135–145)

## 2018-02-02 MED ORDER — OXYCODONE HCL 5 MG PO TABS: 5.0000 mg | ORAL_TABLET | Freq: Four times a day (QID) | ORAL | 0 refills | Status: AC | PRN

## 2018-02-02 NOTE — Progress Notes (Signed)
   VASCULAR SURGERY ASSESSMENT & PLAN:   1 Day Post-Op s/p: Left carotid endarterectomy.  The patient is doing well.  Discharge today.  Follow-up with Dr. Tawanna Coolerodd early in 2 weeks.   SUBJECTIVE:   No complaints.  PHYSICAL EXAM:   Vitals:   02/01/18 2015 02/01/18 2025 02/01/18 2352 02/02/18 0400  BP:  (!) 117/52 (!) 135/53 (!) 130/53  Pulse: 68 74 60 61  Resp: 17 19 16  (!) 23  Temp:  99.3 F (37.4 C) 99 F (37.2 C) 98.7 F (37.1 C)  TempSrc:  Oral Oral Oral  SpO2: 96% 97% 97% 96%  Weight:      Height:       Left neck incision looks fine. Neuro intact. Tongue is midline.  LABS:   Lab Results  Component Value Date   WBC 9.5 02/02/2018   HGB 9.5 (L) 02/02/2018   HCT 28.4 (L) 02/02/2018   MCV 95.3 02/02/2018   PLT 189 02/02/2018   Lab Results  Component Value Date   CREATININE 1.13 02/02/2018   Lab Results  Component Value Date   INR 1.01 02/01/2018   CBG (last 3)  Recent Labs    02/01/18 0548 02/01/18 1024  GLUCAP 92 98    PROBLEM LIST:    Active Problems:   Carotid stenosis   CURRENT MEDS:   . amLODipine  10 mg Oral Daily  . aspirin EC  81 mg Oral Daily  . atorvastatin  80 mg Oral QPM  . chlorthalidone  12.5 mg Oral BH-q7a  . clopidogrel  75 mg Oral Daily  . docusate sodium  100 mg Oral Daily  . enoxaparin (LOVENOX) injection  40 mg Subcutaneous Q24H  . ezetimibe  10 mg Oral Q lunch  . losartan  100 mg Oral Daily  . metFORMIN  500 mg Oral BID WC  . pantoprazole  40 mg Oral Daily    Waverly FerrariChristopher  Beeper: [phone] Office: [phone] 02/02/2018

## 2018-02-02 NOTE — Discharge Summary (Signed)
Discharge Summary     Glenn Rollins Aug 08, 1950 67 y.o. male  [id number]  Admission Date: 02/01/2018  Discharge Date: 02/02/18  Physician: Larina Earthly, MD  Admission Diagnosis: LEFT CAROTID ARTERY STENOSIS  Discharge Day services:    see progress note 02/02/18 Physical Exam: Vitals:   02/02/18 0400 02/02/18 0829  BP: (!) 130/53 140/60  Pulse: 61 (!) 59  Resp: (!) 23 18  Temp: 98.7 F (37.1 C) 97.7 F (36.5 C)  SpO2: 96% 96%    Hospital Course:  The patient was admitted to the hospital and taken to the operating room on 02/01/2018 and underwent left carotid endarterectomy.  The pt tolerated the procedure well and was transported to the PACU in good condition.   By POD 1, the pt neuro status remains at baseline  The remainder of the hospital course consisted of increasing mobilization and increasing intake of solids without difficulty.  He will follow-up in office in about 2 weeks to see Dr. Arbie Cookey.  He will be prescribed 1 to 2 days of narcotic pain medication for continued postoperative pain control.  Discharge instructions were reviewed with the patient and he voices his understanding.  He will be discharged later this morning in stable condition.   Recent Labs    02/01/18 0636 02/02/18 0400  NA 138 136  K 3.9 4.3  CL 102 102  CO2 26 23  GLUCOSE 100* 157*  BUN 15 20  CALCIUM 9.3 9.0   Recent Labs    02/01/18 0636 02/02/18 0400  WBC 4.7 9.5  HGB 10.5* 9.5*  HCT 31.6* 28.4*  PLT 215 189   Recent Labs    02/01/18 0636  INR 1.01       Discharge Diagnosis:  LEFT CAROTID ARTERY STENOSIS  Secondary Diagnosis: Patient Active Problem List   Diagnosis Date Noted  . Carotid stenosis 02/01/2018  . Lumbar radiculopathy, right 04/12/2016  . Peripheral neuropathy 04/12/2016  . Orthostatic dizziness 04/12/2016   Past Medical History:  Diagnosis Date  . Aortic stenosis    mild AS 06/2017  . CAD (coronary artery disease)   . Carotid  artery occlusion   . Diabetes mellitus without complication (HCC)   . Diverticulitis   . Dizziness   . Gout   . Hyperlipemia   . Hypertension   . Peripheral vascular disease (HCC)     Allergies as of 02/02/2018      Reactions   Ace Inhibitors    Pt states he does not remember    Prednisone    BP went up      Medication List    TAKE these medications   amLODipine 10 MG tablet Commonly known as:  NORVASC Take 10 mg by mouth daily.   aspirin EC 81 MG tablet Take 81 mg by mouth daily.   atorvastatin 80 MG tablet Commonly known as:  LIPITOR Take 80 mg by mouth every evening.   chlorthalidone 25 MG tablet Commonly known as:  HYGROTON Take 12.5 mg by mouth every morning.   clopidogrel 75 MG tablet Commonly known as:  PLAVIX Take 75 mg by mouth daily.   diclofenac sodium 1 % Gel Commonly known as:  VOLTAREN Apply 1 application topically 3 (three) times daily as needed (pain). Notes to patient:  No recent doses, take as ordered.   ezetimibe 10 MG tablet Commonly known as:  ZETIA Take 10 mg by mouth daily with lunch.   Fish Oil 1000 MG Caps Take 1 capsule by mouth  2 (two) times daily. Notes to patient:  No recent doses, take as ordered.   losartan 100 MG tablet Commonly known as:  COZAAR Take 100 mg by mouth daily.   metFORMIN 500 MG tablet Commonly known as:  GLUCOPHAGE Take 500 mg by mouth 2 (two) times daily with a meal.   MULTI VITAMIN DAILY PO Take 1 tablet by mouth daily.   oxyCODONE 5 MG immediate release tablet Commonly known as:  Oxy IR/ROXICODONE Take 1 tablet (5 mg total) by mouth every 6 (six) hours as needed for moderate pain. Notes to patient:  No recent doses, take as ordered.        Discharge Instructions:   [facility] Discharge Instructions Carotid Endarterectomy (CEA)  Please refer to the following instructions for your post-procedure care. Your surgeon or physician assistant will discuss any  changes with you.  Activity  You are encouraged to walk as much as you can. You can slowly return to normal activities but must avoid strenuous activity and heavy lifting until your doctor tell you it's OK. Avoid activities such as vacuuming or swinging a golf club. You can drive after one week if you are comfortable and you are no longer taking prescription pain medications. It is normal to feel tired for serval weeks after your surgery. It is also normal to have difficulty with sleep habits, eating, and bowel movements after surgery. These will go away with time.  Bathing/Showering  You may shower after you come home. Do not soak in a bathtub, hot tub, or swim until the incision heals completely.  Incision Care  Shower every day. Clean your incision with mild soap and water. Pat the area dry with a clean towel. You do not need a bandage unless otherwise instructed. Do not apply any ointments or creams to your incision. You may have skin glue on your incision. Do not peel it off. It will come off on its own in about one week. Your incision may feel thickened and raised for several weeks after your surgery. This is normal and the skin will soften over time. For Men Only: It's OK to shave around the incision but do not shave the incision itself for 2 weeks. It is common to have numbness under your chin that could last for several months.  Diet  Resume your normal diet. There are no special food restrictions following this procedure. A low fat/low cholesterol diet is recommended for all patients with vascular disease. In order to heal from your surgery, it is CRITICAL to get adequate nutrition. Your body requires vitamins, minerals, and protein. Vegetables are the best source of vitamins and minerals. Vegetables also provide the perfect balance of protein. Processed food has little nutritional value, so try to avoid this.  Medications  Resume taking all of your medications unless your doctor or  physician assistant tells you not to.  If your incision is causing pain, you may take over-the- counter pain relievers such as acetaminophen (Tylenol). If you were prescribed a stronger pain medication, please be aware these medications can cause nausea and constipation.  Prevent nausea by taking the medication with a snack or meal. Avoid constipation by drinking plenty of fluids and eating foods with a high amount of fiber, such as fruits, vegetables, and grains. Do not take Tylenol if you are taking prescription pain medications.  Follow Up  Our office will schedule a follow up appointment 2-3 weeks following discharge.  Please call us immediately for  any of the following conditions  Increased pain, redness, drainage (pus) from your incision site. Fever of 101 degrees or higher. If you should develop stroke (slurred speech, difficulty swallowing, weakness on one side of your body, loss of vision) you should call 911 and go to the nearest emergency room.  Reduce your risk of vascular disease:  Stop smoking. If you would like help call [facility] at 1-800-QUIT-NOW (351-051-2196) or [place] at 3468327734. Manage your cholesterol Maintain a desired weight Control your diabetes Keep your blood pressure down  If you have any questions, please call the office at [phone].  Disposition: home  Patient's condition: is Good  Follow up: 1. Dr. Arbie Cookey in 2 weeks.   Emilie Rutter, PA-C [facility] [phone]   --- For [facility] Registry use ---   Modified Rankin score at D/C (0-6): 0  IV medication needed for:  1. Hypertension: No 2. Hypotension: No  Post-op Complications: No  1. Post-op CVA or TIA: No  If yes: Event classification (right eye, left eye, right cortical, left cortical, verterobasilar, other):   If yes: Timing of event (intra-op, <6 hrs post-op, >=6 hrs post-op, unknown):   2. CN injury: No  If yes: CN  injuried   3. Myocardial  infarction: No  If yes: Dx by (EKG or clinical, Troponin):   4.  CHF: No  5.  Dysrhythmia (new): No  6. Wound infection: No  7. Reperfusion symptoms: No  8. Return to OR: No  If yes: return to OR for (bleeding, neurologic, other CEA incision, other):   Discharge medications: Statin use:  Yes ASA use:  Yes   Beta blocker use:  No ACE-Inhibitor use:  No  ARB use:  Yes CCB use: Yes P2Y12 Antagonist use: Yes, [x ] Plavix, [ ]  Plasugrel, [ ]  Ticlopinine, [ ]  Ticagrelor, [ ]  Other, [ ]  No for medical reason, [ ]  Non-compliant, [ ]  Not-indicated Anti-coagulant use:  No, [ ]  Warfarin, [ ]  Rivaroxaban, [ ]  Dabigatran,

## 2018-02-02 NOTE — Progress Notes (Signed)
Orders for discharge received.  Pt IV removed and telemetry removed, CCMD notified.  Pt eating breakfast, would like to wait to receive discharge instructions until wife arrives.

## 2018-02-20 ENCOUNTER — Ambulatory Visit (INDEPENDENT_AMBULATORY_CARE_PROVIDER_SITE_OTHER): Payer: Self-pay | Admitting: Vascular Surgery

## 2018-02-20 ENCOUNTER — Other Ambulatory Visit: Payer: Self-pay

## 2018-02-20 ENCOUNTER — Encounter: Payer: Self-pay | Admitting: Vascular Surgery

## 2018-02-20 VITALS — BP 131/60 | HR 64 | Temp 98.5°F | Resp 20 | Ht 72.0 in | Wt 197.0 lb

## 2018-02-20 DIAGNOSIS — I6523 Occlusion and stenosis of bilateral carotid arteries: Secondary | ICD-10-CM

## 2018-02-20 NOTE — Progress Notes (Signed)
    Postoperative Visit   History of Present Illness   Glenn Rollins Height is a 67 y.o. male who presents for postoperative follow-up for: left CEA by Dr. Arbie CookeyEarly (Date: 02/01/18) due to asymptomatic carotid stenosis.  The patient's neck incision is healing well.  The patient has not had stroke or TIA symptoms.  He has known bilateral vertebral artery occlusions.  He is taking aspirin and statin daily.  Patient is also on Plavix per Cardiology for CAD with history of CABG.  Current Outpatient Medications  Medication Sig Dispense Refill  . amLODipine (NORVASC) 10 MG tablet Take 10 mg by mouth daily.     Marland Kitchen. aspirin EC 81 MG tablet Take 81 mg by mouth daily.    Marland Kitchen. atorvastatin (LIPITOR) 80 MG tablet Take 80 mg by mouth every evening.     . chlorthalidone (HYGROTON) 25 MG tablet Take 12.5 mg by mouth every morning.     . clopidogrel (PLAVIX) 75 MG tablet Take 75 mg by mouth daily.     . diclofenac sodium (VOLTAREN) 1 % GEL Apply 1 application topically 3 (three) times daily as needed (pain).    Marland Kitchen. ezetimibe (ZETIA) 10 MG tablet Take 10 mg by mouth daily with lunch.     . losartan (COZAAR) 100 MG tablet Take 100 mg by mouth daily.     . metFORMIN (GLUCOPHAGE) 500 MG tablet Take 500 mg by mouth 2 (two) times daily with a meal.     . Multiple Vitamin (MULTI VITAMIN DAILY PO) Take 1 tablet by mouth daily.     . Omega-3 Fatty Acids (FISH OIL) 1000 MG CAPS Take 1 capsule by mouth 2 (two) times daily.     Marland Kitchen. oxyCODONE (OXY IR/ROXICODONE) 5 MG immediate release tablet Take 1 tablet (5 mg total) by mouth every 6 (six) hours as needed for moderate pain. (Patient not taking: Reported on 02/20/2018) 15 tablet 0   No current facility-administered medications for this visit.     For [facility] Use Only   PRE-ADM LIVING: Home  AMB STATUS: Ambulatory   Physical Examination   Vitals:   02/20/18 1542 02/20/18 1545  BP: (!) 128/56 131/60  Pulse: 64   Resp: 20   Temp: 98.5 F (36.9 C)   SpO2: 97%   Weight:  197 lb (89.4 kg)   Height: 6' (1.829 m)     left Neck: Incision is healing well  Neuro: CN 2-12 are intact, Motor strength is 5/5 bilaterally, sensation is grossly intact   Medical Decision Making   Glenn Rollins is a 67 y.o. male who presents s/p left CEA.   Left neck incision is well healed  Neurologically, patient remains at baseline and denies any signs or symptoms of TIA/CVA  Continue aspirin and statin regimen  Check carotid duplex in 9 months per protocol  Emilie RutterMatthew Camary Sosa PA-C [facility] [place] Office: [phone]
